# Patient Record
Sex: Male | Born: 1995 | Race: White | Hispanic: No | Marital: Single | Smoking: Never smoker
Health system: Southern US, Community
[De-identification: ages and names within clinical notes are randomized; demographics above are authoritative.]

## PROBLEM LIST (undated history)

## (undated) DIAGNOSIS — R569 Unspecified convulsions: Secondary | ICD-10-CM

## (undated) HISTORY — PX: CIRCUMCISION: SUR203

## (undated) HISTORY — DX: Unspecified convulsions: R56.9

---
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
## 2016-03-09 ENCOUNTER — Other Ambulatory Visit: Payer: Self-pay | Admitting: Pediatrics

## 2016-03-09 ENCOUNTER — Other Ambulatory Visit: Payer: Self-pay | Admitting: Family

## 2016-04-06 ENCOUNTER — Encounter (INDEPENDENT_AMBULATORY_CARE_PROVIDER_SITE_OTHER): Payer: Self-pay | Admitting: Pediatrics

## 2016-04-06 ENCOUNTER — Ambulatory Visit (INDEPENDENT_AMBULATORY_CARE_PROVIDER_SITE_OTHER): Payer: Medicaid Other | Admitting: Pediatrics

## 2016-04-06 VITALS — BP 128/68 | HR 72 | Ht 67.0 in | Wt 158.8 lb

## 2016-04-06 DIAGNOSIS — F840 Autistic disorder: Secondary | ICD-10-CM | POA: Diagnosis not present

## 2016-04-06 DIAGNOSIS — G40209 Localization-related (focal) (partial) symptomatic epilepsy and epileptic syndromes with complex partial seizures, not intractable, without status epilepticus: Secondary | ICD-10-CM

## 2016-04-06 MED ORDER — DIASTAT ACUDIAL 20 MG RE GEL: RECTAL | 5 refills | Status: DC

## 2016-04-06 MED ORDER — LEVETIRACETAM ER 750 MG PO TB24: ORAL_TABLET | ORAL | 5 refills | Status: DC

## 2016-04-06 NOTE — Patient Instructions (Signed)
Please let me know how well this works as regards his nonconvulsive seizures.  I am glad that you signed up for My Chart.
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
## 2016-04-07 ENCOUNTER — Other Ambulatory Visit (INDEPENDENT_AMBULATORY_CARE_PROVIDER_SITE_OTHER): Payer: Self-pay | Admitting: Pediatrics

## 2016-04-21 ENCOUNTER — Encounter (INDEPENDENT_AMBULATORY_CARE_PROVIDER_SITE_OTHER): Payer: Self-pay | Admitting: Pediatrics

## 2016-04-21 DIAGNOSIS — G40209 Localization-related (focal) (partial) symptomatic epilepsy and epileptic syndromes with complex partial seizures, not intractable, without status epilepticus: Secondary | ICD-10-CM

## 2016-04-30 NOTE — Telephone Encounter (Signed)
My Chart note

## 2016-05-20 ENCOUNTER — Encounter (INDEPENDENT_AMBULATORY_CARE_PROVIDER_SITE_OTHER): Payer: Self-pay | Admitting: Pediatrics

## 2016-05-25 ENCOUNTER — Encounter (INDEPENDENT_AMBULATORY_CARE_PROVIDER_SITE_OTHER): Payer: Self-pay | Admitting: Pediatrics

## 2016-05-25 ENCOUNTER — Ambulatory Visit (INDEPENDENT_AMBULATORY_CARE_PROVIDER_SITE_OTHER): Payer: Medicaid Other | Admitting: Pediatrics

## 2016-05-25 VITALS — BP 104/58 | HR 112 | Ht 67.25 in | Wt 160.2 lb

## 2016-05-25 DIAGNOSIS — G43009 Migraine without aura, not intractable, without status migrainosus: Secondary | ICD-10-CM | POA: Diagnosis not present

## 2016-05-25 DIAGNOSIS — G40209 Localization-related (focal) (partial) symptomatic epilepsy and epileptic syndromes with complex partial seizures, not intractable, without status epilepticus: Secondary | ICD-10-CM

## 2016-05-25 DIAGNOSIS — F840 Autistic disorder: Secondary | ICD-10-CM

## 2016-05-25 MED ORDER — RELPAX 40 MG PO TABS: ORAL_TABLET | ORAL | 5 refills | Status: DC

## 2016-05-25 MED ORDER — LEVETIRACETAM ER 750 MG PO TB24: ORAL_TABLET | ORAL | 5 refills | Status: DC

## 2016-05-25 NOTE — Patient Instructions (Signed)
Please send me headache calendars at the end of each month and use My Chart to let me know how often year seeing seizures.  Don't think the episode where he was unable to depress his eyes represents a seizure because he was able to respond to you.
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
## 2016-05-26 ENCOUNTER — Ambulatory Visit (INDEPENDENT_AMBULATORY_CARE_PROVIDER_SITE_OTHER): Payer: Medicaid Other | Admitting: Pediatrics

## 2016-06-29 ENCOUNTER — Emergency Department: Payer: Medicaid Other

## 2016-06-29 ENCOUNTER — Emergency Department
Admission: EM | Admit: 2016-06-29 | Discharge: 2016-06-29 | Disposition: A | Discharge status: Home / Self Care | Payer: Medicaid Other | Attending: Emergency Medicine | Admitting: Emergency Medicine

## 2016-06-29 DIAGNOSIS — R509 Fever, unspecified: Secondary | ICD-10-CM | POA: Diagnosis present

## 2016-06-29 DIAGNOSIS — J181 Lobar pneumonia, unspecified organism: Secondary | ICD-10-CM | POA: Diagnosis not present

## 2016-06-29 DIAGNOSIS — E109 Type 1 diabetes mellitus without complications: Secondary | ICD-10-CM | POA: Insufficient documentation

## 2016-06-29 DIAGNOSIS — J189 Pneumonia, unspecified organism: Secondary | ICD-10-CM

## 2016-06-29 LAB — CBC WITH DIFFERENTIAL/PLATELET
BASOS ABS: 0 10*3/uL (ref 0–0.1)
Basophils Relative: 0 %
EOS ABS: 0 10*3/uL (ref 0–0.7)
EOS PCT: 0 %
HCT: 36.2 % — ABNORMAL LOW (ref 40.0–52.0)
Hemoglobin: 12.3 g/dL — ABNORMAL LOW (ref 13.0–18.0)
LYMPHS ABS: 0.5 10*3/uL — AB (ref 1.0–3.6)
Lymphocytes Relative: 4 %
MCH: 27.1 pg (ref 26.0–34.0)
MCHC: 34 g/dL (ref 32.0–36.0)
MCV: 79.8 fL — ABNORMAL LOW (ref 80.0–100.0)
MONO ABS: 0.4 10*3/uL (ref 0.2–1.0)
Monocytes Relative: 4 %
Neutro Abs: 10.6 10*3/uL — ABNORMAL HIGH (ref 1.4–6.5)
Neutrophils Relative %: 92 %
PLATELETS: 257 10*3/uL (ref 150–440)
RBC: 4.54 MIL/uL (ref 4.40–5.90)
RDW: 12.4 % (ref 11.5–14.5)
WBC: 11.5 10*3/uL — AB (ref 3.8–10.6)

## 2016-06-29 LAB — BASIC METABOLIC PANEL
Anion gap: 8 (ref 5–15)
BUN: 10 mg/dL (ref 6–20)
CO2: 22 mmol/L (ref 22–32)
CREATININE: 1 mg/dL (ref 0.61–1.24)
Calcium: 8.7 mg/dL — ABNORMAL LOW (ref 8.9–10.3)
Chloride: 104 mmol/L (ref 101–111)
GFR calc Af Amer: >60 mL/min (ref >60)
GLUCOSE: 168 mg/dL — AB (ref 65–99)
POTASSIUM: 3.5 mmol/L (ref 3.5–5.1)
SODIUM: 134 mmol/L — AB (ref 135–145)

## 2016-06-29 LAB — INFLUENZA PANEL BY PCR (TYPE A & B)
INFLAPCR: NEGATIVE
INFLBPCR: NEGATIVE

## 2016-06-29 LAB — LACTIC ACID, PLASMA: LACTIC ACID, VENOUS: 0.9 mmol/L (ref 0.5–1.9)

## 2016-06-29 MED ORDER — SODIUM CHLORIDE 0.9 % IV BOLUS (SEPSIS)
1000.0000 mL | Freq: Once | INTRAVENOUS | Status: AC
  Administered 2016-06-29: 1000 mL via INTRAVENOUS

## 2016-06-29 MED ORDER — HYDROCOD POLST-CPM POLST ER 10-8 MG/5ML PO SUER
5.0000 mL | Freq: Once | ORAL | Status: AC
  Administered 2016-06-29: 5 mL via ORAL
  Filled 2016-06-29: qty 5

## 2016-06-29 MED ORDER — DEXTROSE 5 % IV SOLN: 1.0000 g | INTRAVENOUS | Status: DC

## 2016-06-29 MED ORDER — CEFTRIAXONE SODIUM-DEXTROSE 1-3.74 GM-% IV SOLR
1.0000 g | INTRAVENOUS | Status: DC
  Administered 2016-06-29: 1 g via INTRAVENOUS
  Filled 2016-06-29: qty 50

## 2016-06-29 MED ORDER — ONDANSETRON HCL 4 MG/2ML IJ SOLN
4.0000 mg | Freq: Once | INTRAMUSCULAR | Status: AC
  Administered 2016-06-29: 4 mg via INTRAVENOUS
  Filled 2016-06-29: qty 2

## 2016-06-29 MED ORDER — HYDROCOD POLST-CPM POLST ER 10-8 MG/5ML PO SUER: 5.0000 mL | Freq: Two times a day (BID) | ORAL | 0 refills | Status: DC

## 2016-06-29 MED ORDER — IBUPROFEN 800 MG PO TABS
800.0000 mg | ORAL_TABLET | Freq: Once | ORAL | Status: AC
  Administered 2016-06-29: 800 mg via ORAL
  Filled 2016-06-29: qty 1

## 2016-06-29 MED ORDER — CEPHALEXIN 500 MG PO CAPS: 500.0000 mg | ORAL_CAPSULE | Freq: Three times a day (TID) | ORAL | 0 refills | Status: DC

## 2016-06-29 NOTE — ED Triage Notes (Addendum)
Pt arrived via ems from home with complaints of persistent cough that worried mother. Pt is autistic and per mom pt has very little verbal communication ability. Mother reports their family has suffered a respiratory infection and everyone but pt has gotten better. Mother reports that tonight pt began to uncontrollable shake which caused her to call ems. Pt does suffer from seizures however mother reports "that was not a seizure."

## 2016-06-29 NOTE — ED Notes (Signed)
Pt no longer coughing.
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
## 2016-06-29 NOTE — Discharge Instructions (Signed)
1. You may alternate Tylenol and Motrin every 4 hours as needed for fever greater than 100.44F. 2. Take antibiotic as prescribed (Keflex 500 mg 3 times daily 7 days. 3. You may take Tussionex twice daily as needed for cough. 4. Return to the ER for worsening symptoms, persistent vomiting, difficulty breathing or other concerns.

## 2016-07-04 LAB — CULTURE, BLOOD (ROUTINE X 2)
Culture: NO GROWTH
Culture: NO GROWTH

## 2016-09-23 ENCOUNTER — Encounter (INDEPENDENT_AMBULATORY_CARE_PROVIDER_SITE_OTHER): Payer: Self-pay | Admitting: Pediatrics

## 2017-02-08 ENCOUNTER — Ambulatory Visit (INDEPENDENT_AMBULATORY_CARE_PROVIDER_SITE_OTHER): Payer: Medicaid Other | Admitting: Pediatrics

## 2017-02-27 ENCOUNTER — Ambulatory Visit (INDEPENDENT_AMBULATORY_CARE_PROVIDER_SITE_OTHER): Payer: Medicaid Other | Admitting: Pediatrics

## 2017-02-28 ENCOUNTER — Ambulatory Visit (INDEPENDENT_AMBULATORY_CARE_PROVIDER_SITE_OTHER): Payer: Medicaid Other | Admitting: Pediatrics

## 2017-03-14 ENCOUNTER — Ambulatory Visit (INDEPENDENT_AMBULATORY_CARE_PROVIDER_SITE_OTHER): Payer: Medicaid Other | Admitting: Pediatrics

## 2017-03-27 ENCOUNTER — Telehealth (INDEPENDENT_AMBULATORY_CARE_PROVIDER_SITE_OTHER): Payer: Self-pay | Admitting: Pediatrics

## 2017-03-27 DIAGNOSIS — G40209 Localization-related (focal) (partial) symptomatic epilepsy and epileptic syndromes with complex partial seizures, not intractable, without status epilepticus: Secondary | ICD-10-CM

## 2017-03-27 MED ORDER — LEVETIRACETAM ER 750 MG PO TB24: ORAL_TABLET | ORAL | 5 refills | Status: DC

## 2017-03-27 NOTE — Telephone Encounter (Signed)
Rx has been sent electronically to the pharmacy on file 

## 2017-03-27 NOTE — Telephone Encounter (Signed)
°  Who's calling (name and relationship to patient) : Marcelino Duster, legal guardian Best contact number: [phone] Provider they see: Sharene Skeans Reason for call:     PRESCRIPTION REFILL ONLY  Name of prescription: Keppra  Pharmacy: [facility] in [place]

## 2017-04-17 ENCOUNTER — Encounter (INDEPENDENT_AMBULATORY_CARE_PROVIDER_SITE_OTHER): Payer: Self-pay | Admitting: Pediatrics

## 2017-04-17 ENCOUNTER — Ambulatory Visit (INDEPENDENT_AMBULATORY_CARE_PROVIDER_SITE_OTHER): Payer: Medicaid Other | Admitting: Pediatrics

## 2017-04-17 VITALS — BP 110/70 | HR 76 | Ht 67.25 in | Wt 160.6 lb

## 2017-04-17 DIAGNOSIS — G43009 Migraine without aura, not intractable, without status migrainosus: Secondary | ICD-10-CM

## 2017-04-17 DIAGNOSIS — F840 Autistic disorder: Secondary | ICD-10-CM | POA: Diagnosis not present

## 2017-04-17 DIAGNOSIS — G40209 Localization-related (focal) (partial) symptomatic epilepsy and epileptic syndromes with complex partial seizures, not intractable, without status epilepticus: Secondary | ICD-10-CM

## 2017-04-17 MED ORDER — LEVETIRACETAM ER 750 MG PO TB24: ORAL_TABLET | ORAL | 5 refills | Status: DC

## 2017-04-17 MED ORDER — RELPAX 40 MG PO TABS: ORAL_TABLET | ORAL | 5 refills | Status: DC

## 2017-04-17 MED ORDER — DIASTAT ACUDIAL 20 MG RE GEL: RECTAL | 5 refills | Status: DC

## 2017-04-17 MED ORDER — PROMETHAZINE HCL 25 MG RE SUPP: 25.0000 mg | Freq: Three times a day (TID) | RECTAL | 5 refills | Status: DC | PRN
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
## 2017-07-08 ENCOUNTER — Encounter (INDEPENDENT_AMBULATORY_CARE_PROVIDER_SITE_OTHER): Payer: Self-pay | Admitting: Pediatrics

## 2017-07-11 ENCOUNTER — Telehealth (INDEPENDENT_AMBULATORY_CARE_PROVIDER_SITE_OTHER): Payer: Self-pay | Admitting: Pediatrics

## 2017-07-11 DIAGNOSIS — R112 Nausea with vomiting, unspecified: Secondary | ICD-10-CM

## 2017-07-11 MED ORDER — PROMETHAZINE HCL 25 MG PO TABS: ORAL_TABLET | ORAL | 0 refills | Status: DC

## 2017-07-11 NOTE — Telephone Encounter (Signed)
At mother's request a prescription was issued for oral promethazine.

## 2017-08-01 ENCOUNTER — Encounter (INDEPENDENT_AMBULATORY_CARE_PROVIDER_SITE_OTHER): Payer: Self-pay | Admitting: Pediatrics

## 2017-09-06 ENCOUNTER — Other Ambulatory Visit (INDEPENDENT_AMBULATORY_CARE_PROVIDER_SITE_OTHER): Payer: Self-pay | Admitting: Pediatrics

## 2017-09-06 DIAGNOSIS — R112 Nausea with vomiting, unspecified: Secondary | ICD-10-CM

## 2017-10-11 ENCOUNTER — Other Ambulatory Visit (INDEPENDENT_AMBULATORY_CARE_PROVIDER_SITE_OTHER): Payer: Self-pay | Admitting: Pediatrics

## 2017-10-11 DIAGNOSIS — G43009 Migraine without aura, not intractable, without status migrainosus: Secondary | ICD-10-CM

## 2017-10-19 ENCOUNTER — Telehealth (INDEPENDENT_AMBULATORY_CARE_PROVIDER_SITE_OTHER): Payer: Self-pay | Admitting: Pediatrics

## 2017-10-19 NOTE — Telephone Encounter (Signed)
PA has been completed. We are waiting on approval

## 2017-10-19 NOTE — Telephone Encounter (Signed)
°  Who's calling (name and relationship to patient) : Marcelino Duster (Mother) Best contact number: [phone] Provider they see: Dr. Sharene Skeans  Reason for call: Mom stated a prior authorization is needed for Relpax.

## 2017-11-21 ENCOUNTER — Ambulatory Visit (INDEPENDENT_AMBULATORY_CARE_PROVIDER_SITE_OTHER): Payer: Medicaid Other | Admitting: Pediatrics

## 2017-12-12 ENCOUNTER — Ambulatory Visit (INDEPENDENT_AMBULATORY_CARE_PROVIDER_SITE_OTHER): Payer: Medicaid Other | Admitting: Pediatrics

## 2017-12-12 ENCOUNTER — Telehealth (INDEPENDENT_AMBULATORY_CARE_PROVIDER_SITE_OTHER): Payer: Self-pay | Admitting: Pediatrics

## 2017-12-12 NOTE — Telephone Encounter (Signed)
°  Who's calling (name and relationship to patient) : Marcelino DusterMichelle (Mother) Best contact number: [phone] Provider they see: Dr. Sharene SkeansHickling Reason for call: Mom lvm stating she needed to rs pt's appt. I placed call to mom and rs appt.

## 2017-12-18 ENCOUNTER — Other Ambulatory Visit (INDEPENDENT_AMBULATORY_CARE_PROVIDER_SITE_OTHER): Payer: Self-pay | Admitting: Pediatrics

## 2017-12-18 DIAGNOSIS — R112 Nausea with vomiting, unspecified: Secondary | ICD-10-CM

## 2017-12-28 ENCOUNTER — Ambulatory Visit (INDEPENDENT_AMBULATORY_CARE_PROVIDER_SITE_OTHER): Payer: Medicaid Other | Admitting: Pediatrics

## 2018-01-23 ENCOUNTER — Encounter (INDEPENDENT_AMBULATORY_CARE_PROVIDER_SITE_OTHER): Payer: Self-pay | Admitting: Pediatrics

## 2018-01-23 ENCOUNTER — Ambulatory Visit (INDEPENDENT_AMBULATORY_CARE_PROVIDER_SITE_OTHER): Payer: Medicaid Other | Admitting: Pediatrics

## 2018-01-23 VITALS — BP 120/70 | HR 64 | Ht 68.0 in | Wt 168.2 lb

## 2018-01-23 DIAGNOSIS — F840 Autistic disorder: Secondary | ICD-10-CM

## 2018-01-23 DIAGNOSIS — F802 Mixed receptive-expressive language disorder: Secondary | ICD-10-CM | POA: Diagnosis not present

## 2018-01-23 DIAGNOSIS — R112 Nausea with vomiting, unspecified: Secondary | ICD-10-CM

## 2018-01-23 DIAGNOSIS — G40209 Localization-related (focal) (partial) symptomatic epilepsy and epileptic syndromes with complex partial seizures, not intractable, without status epilepticus: Secondary | ICD-10-CM

## 2018-01-23 DIAGNOSIS — G43009 Migraine without aura, not intractable, without status migrainosus: Secondary | ICD-10-CM

## 2018-01-23 MED ORDER — RELPAX 40 MG PO TABS: ORAL_TABLET | ORAL | 5 refills | Status: DC

## 2018-01-23 MED ORDER — LEVETIRACETAM ER 750 MG PO TB24: ORAL_TABLET | ORAL | 5 refills | Status: DC

## 2018-01-23 MED ORDER — DIASTAT ACUDIAL 20 MG RE GEL: RECTAL | 5 refills | Status: DC

## 2018-01-23 MED ORDER — PROMETHAZINE HCL 25 MG PO TABS: ORAL_TABLET | ORAL | 5 refills | Status: DC
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
## 2018-01-23 NOTE — Patient Instructions (Signed)
I am concerned about the increase in frequency of migraines.  I am glad that when he takes Relpax that it brings a headache under control quickly.  Wanted to keep track of the headache calendar.  There are 3 lifestyle behaviors that are important to minimize headaches.  You should sleep 8-9 hours at night time.  Bedtime should be a set time for going to bed and waking up with few exceptions.  You need to drink about 40-8 ounces of water per day, more on days when you are out in the heat.  This works out to 2 1/2 - 3 - 16 ounce water bottles per day.  You may need to flavor the water so that you will be more likely to drink it.  Do not use Kool-Aid or other sugar drinks because they add empty calories and actually increase urine output.  You need to eat 3 meals per day.  You should not skip meals.  The meal does not have to be a big one.  Make daily entries into the headache calendar and sent it to me at the end of each calendar month.  I will call you or your parents and we will discuss the results of the headache calendar and make a decision about changing treatment if indicated.  You should take 400 mg of ibuprofen at the onset of headaches that are severe enough to cause obvious pain and other symptoms.  Relpax if it appears that this is a migrainous problem.  Give him promethazine if he seems nauseated.  Please use My Chart to communicate with me and send his calendars.

## 2018-03-26 ENCOUNTER — Other Ambulatory Visit (INDEPENDENT_AMBULATORY_CARE_PROVIDER_SITE_OTHER): Payer: Self-pay | Admitting: Pediatrics

## 2018-03-26 DIAGNOSIS — G43009 Migraine without aura, not intractable, without status migrainosus: Secondary | ICD-10-CM

## 2018-04-17 ENCOUNTER — Encounter (INDEPENDENT_AMBULATORY_CARE_PROVIDER_SITE_OTHER): Payer: Self-pay

## 2018-04-18 ENCOUNTER — Encounter (INDEPENDENT_AMBULATORY_CARE_PROVIDER_SITE_OTHER): Payer: Self-pay | Admitting: Pediatrics

## 2018-04-20 NOTE — Telephone Encounter (Signed)
I wrote the letter requested.  Please fax it to the agency specified in this note.

## 2018-04-20 NOTE — Progress Notes (Signed)
I wrote a letter to you please send it to the agency requested by mother.

## 2018-07-02 ENCOUNTER — Other Ambulatory Visit (INDEPENDENT_AMBULATORY_CARE_PROVIDER_SITE_OTHER): Payer: Self-pay | Admitting: Pediatrics

## 2018-07-02 DIAGNOSIS — G40209 Localization-related (focal) (partial) symptomatic epilepsy and epileptic syndromes with complex partial seizures, not intractable, without status epilepticus: Secondary | ICD-10-CM

## 2018-07-02 MED ORDER — LEVETIRACETAM ER 750 MG PO TB24: ORAL_TABLET | ORAL | 1 refills | Status: DC

## 2018-07-04 ENCOUNTER — Other Ambulatory Visit (INDEPENDENT_AMBULATORY_CARE_PROVIDER_SITE_OTHER): Payer: Self-pay | Admitting: Pediatrics

## 2018-07-04 DIAGNOSIS — R112 Nausea with vomiting, unspecified: Secondary | ICD-10-CM

## 2018-07-05 NOTE — Telephone Encounter (Signed)
°  Who's calling (name and relationship to patient) : (mom) Best contact number: [phone] Provider they see: Sharene Skeans Reason for call:     PRESCRIPTION REFILL ONLY  Name of prescription: Phenegran 25mg  Pharmacy: [facility], [address]

## 2018-07-05 NOTE — Telephone Encounter (Signed)
Called and LVM to call back

## 2018-08-10 ENCOUNTER — Ambulatory Visit (INDEPENDENT_AMBULATORY_CARE_PROVIDER_SITE_OTHER): Payer: Medicaid Other | Admitting: Pediatrics

## 2018-08-10 ENCOUNTER — Encounter (INDEPENDENT_AMBULATORY_CARE_PROVIDER_SITE_OTHER): Payer: Self-pay | Admitting: Pediatrics

## 2018-08-10 VITALS — BP 130/78 | HR 72 | Ht 68.0 in | Wt 166.0 lb

## 2018-08-10 DIAGNOSIS — G909 Disorder of the autonomic nervous system, unspecified: Secondary | ICD-10-CM | POA: Diagnosis not present

## 2018-08-10 DIAGNOSIS — G43009 Migraine without aura, not intractable, without status migrainosus: Secondary | ICD-10-CM | POA: Diagnosis not present

## 2018-08-10 DIAGNOSIS — F840 Autistic disorder: Secondary | ICD-10-CM

## 2018-08-10 DIAGNOSIS — R112 Nausea with vomiting, unspecified: Secondary | ICD-10-CM

## 2018-08-10 DIAGNOSIS — G40209 Localization-related (focal) (partial) symptomatic epilepsy and epileptic syndromes with complex partial seizures, not intractable, without status epilepticus: Secondary | ICD-10-CM

## 2018-08-10 MED ORDER — LEVETIRACETAM ER 750 MG PO TB24: ORAL_TABLET | ORAL | 1 refills | Status: DC

## 2018-08-10 MED ORDER — PROMETHAZINE HCL 25 MG PO TABS: ORAL_TABLET | ORAL | 5 refills | Status: DC

## 2018-08-10 MED ORDER — LEVETIRACETAM ER 750 MG PO TB24: ORAL_TABLET | ORAL | 5 refills | Status: DC

## 2018-08-10 MED ORDER — RELPAX 40 MG PO TABS: ORAL_TABLET | ORAL | 0 refills | Status: DC

## 2018-08-10 MED ORDER — DIASTAT ACUDIAL 20 MG RE GEL: RECTAL | 5 refills | Status: DC

## 2018-08-10 MED ORDER — RELPAX 40 MG PO TABS: ORAL_TABLET | ORAL | 5 refills | Status: DC
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
## 2018-08-10 NOTE — Patient Instructions (Signed)
I am pleased that Peter [name] is doing well.  I refilled prescriptions that are filled from this office.  We will plan to see him in 6 months.

## 2018-09-01 ENCOUNTER — Encounter (INDEPENDENT_AMBULATORY_CARE_PROVIDER_SITE_OTHER): Payer: Self-pay

## 2019-04-13 ENCOUNTER — Other Ambulatory Visit (INDEPENDENT_AMBULATORY_CARE_PROVIDER_SITE_OTHER): Payer: Self-pay | Admitting: Pediatrics

## 2019-04-13 DIAGNOSIS — G43009 Migraine without aura, not intractable, without status migrainosus: Secondary | ICD-10-CM

## 2019-04-13 DIAGNOSIS — R112 Nausea with vomiting, unspecified: Secondary | ICD-10-CM

## 2019-08-27 ENCOUNTER — Encounter (INDEPENDENT_AMBULATORY_CARE_PROVIDER_SITE_OTHER): Payer: Self-pay

## 2019-08-28 ENCOUNTER — Telehealth: Payer: Self-pay

## 2019-08-28 NOTE — Telephone Encounter (Signed)
I did reach out to the patient today to see if he wants to come in sooner than his 04/20/20 appointment now that Dr. Virgilio Frees will take up to 8 Medicaid patients a month-patient did not answer so I left a VM requesting a call back to re-schedule

## 2019-08-30 ENCOUNTER — Other Ambulatory Visit: Payer: Self-pay

## 2019-08-30 ENCOUNTER — Telehealth (INDEPENDENT_AMBULATORY_CARE_PROVIDER_SITE_OTHER): Payer: Medicaid Other | Admitting: Pediatrics

## 2019-08-30 ENCOUNTER — Encounter (INDEPENDENT_AMBULATORY_CARE_PROVIDER_SITE_OTHER): Payer: Self-pay | Admitting: Pediatrics

## 2019-08-30 VITALS — Wt 176.0 lb

## 2019-08-30 DIAGNOSIS — R112 Nausea with vomiting, unspecified: Secondary | ICD-10-CM

## 2019-08-30 DIAGNOSIS — G40209 Localization-related (focal) (partial) symptomatic epilepsy and epileptic syndromes with complex partial seizures, not intractable, without status epilepticus: Secondary | ICD-10-CM

## 2019-08-30 DIAGNOSIS — G43009 Migraine without aura, not intractable, without status migrainosus: Secondary | ICD-10-CM

## 2019-08-30 DIAGNOSIS — F802 Mixed receptive-expressive language disorder: Secondary | ICD-10-CM | POA: Diagnosis not present

## 2019-08-30 DIAGNOSIS — F840 Autistic disorder: Secondary | ICD-10-CM

## 2019-08-30 MED ORDER — LEVETIRACETAM ER 750 MG PO TB24: ORAL_TABLET | ORAL | 3 refills | Status: DC

## 2019-08-30 MED ORDER — RELPAX 40 MG PO TABS: ORAL_TABLET | ORAL | 5 refills | Status: DC

## 2019-08-30 MED ORDER — PROMETHAZINE HCL 25 MG PO TABS: ORAL_TABLET | ORAL | 5 refills | Status: DC

## 2019-08-30 MED ORDER — DIASTAT ACUDIAL 20 MG RE GEL: RECTAL | 5 refills | Status: DC
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
## 2019-08-30 NOTE — Patient Instructions (Signed)
It was a pleasure to see you today. Thank you for inviting me into your home. I am sorry that Social Security has been such a pain. We will work with them in with you to get records to them once they send [place] the request.  I am glad that his seizures are under control. We talked about Diastat which is the rectal diazepam that you have given him to abort his seizures. I am going to prescribe that. There is another form of diazepam that is nasal called Valtoco. This works quite well but might be somewhat difficult to get into his nose although given his age and size it would seem to me that it might be easier than the Diastat.  I will refill his levetiracetam for seizures, Relpax for his migraines, and promethazine for nausea and vomiting associated with his migraines.  If there is anything else that I can do between now and when I see you again in a year, please let me know.

## 2020-02-02 ENCOUNTER — Emergency Department
Admission: EM | Admit: 2020-02-02 | Discharge: 2020-02-02 | Disposition: A | Discharge status: Home / Self Care | Payer: Medicaid Other | Attending: Emergency Medicine | Admitting: Emergency Medicine

## 2020-02-02 ENCOUNTER — Emergency Department: Payer: Medicaid Other

## 2020-02-02 DIAGNOSIS — Y939 Activity, unspecified: Secondary | ICD-10-CM | POA: Diagnosis not present

## 2020-02-02 DIAGNOSIS — Z79899 Other long term (current) drug therapy: Secondary | ICD-10-CM | POA: Insufficient documentation

## 2020-02-02 DIAGNOSIS — Y929 Unspecified place or not applicable: Secondary | ICD-10-CM | POA: Insufficient documentation

## 2020-02-02 DIAGNOSIS — Y999 Unspecified external cause status: Secondary | ICD-10-CM | POA: Diagnosis not present

## 2020-02-02 DIAGNOSIS — S99922A Unspecified injury of left foot, initial encounter: Secondary | ICD-10-CM | POA: Insufficient documentation

## 2020-02-02 DIAGNOSIS — X58XXXA Exposure to other specified factors, initial encounter: Secondary | ICD-10-CM | POA: Diagnosis not present

## 2020-02-02 DIAGNOSIS — E109 Type 1 diabetes mellitus without complications: Secondary | ICD-10-CM | POA: Insufficient documentation
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
## 2020-02-02 NOTE — ED Triage Notes (Signed)
Pt presents via POV c/o left pinky toe injury. Mother reports blue and red streaks. Pt has hx DM. Type 1. Pt has autism.

## 2020-02-02 NOTE — ED Notes (Signed)
Given crackers and applesauce

## 2020-02-28 ENCOUNTER — Other Ambulatory Visit (INDEPENDENT_AMBULATORY_CARE_PROVIDER_SITE_OTHER): Payer: Self-pay | Admitting: Pediatrics

## 2020-02-28 DIAGNOSIS — G43009 Migraine without aura, not intractable, without status migrainosus: Secondary | ICD-10-CM

## 2020-02-28 NOTE — Telephone Encounter (Signed)
Please send to the pharmacy °

## 2020-03-12 LAB — HM DIABETES EYE EXAM

## 2020-04-19 NOTE — Progress Notes (Signed)
Name: Peter [name]  MRN/ DOB: [id number], [birth year]   Age/ Sex: 24 y.o., male    PCP: Ellamae Sia, MD   Reason for Endocrinology Evaluation: Type 1 Diabetes Mellitus     Date of Initial Endocrinology Visit: 04/20/2020     PATIENT IDENTIFIER: Mr. Peter [name] is a 24 y.o. male with a past medical history of T1DM, Autistic dosorder, ADHD, and epilepsy. The patient presented for initial endocrinology clinic visit on 04/20/2020 for consultative assistance with his diabetes management.    HPI: Mr. Strausbaugh was mother Selinda Eon   Diagnosed with DM at age 21  Currently checking blood sugars every 2 hours Hypoglycemia episodes : 0            Hemoglobin A1c has ranged from 7.3%in 2019, peaking at 12.3% in 2008. Patient required assistance for hypoglycemia: no Patient has required hospitalization within the last 1 year from hyper or hypoglycemia: no  In terms of diet, the patient eats 3 meals a day , drinks a shake between breakfast and lunch   Has to sleep with BG > 100 mg/dL    HOME DIABETES REGIMEN: Lantus 40 units daily   Novolog 1:12 grams  CF 1 unit for every 50 over 150    Statin: NO ACE-I/ARB: No Prior Diabetic Education:Yes   METER DOWNLOAD SUMMARY: Date range evaluated: 10/25-11/01/2020 Average Number Tests/Day = 10.9 Overall Mean FS Glucose = 173   BG Ranges: Low = 75 High = 318   Hypoglycemic Events/30 Days: BG < 50 = 0 Episodes of symptomatic severe hypoglycemia = 0   DIABETIC COMPLICATIONS: Microvascular complications:    Denies: retinopathy, neuropathy, CKD   Last eye exam: Completed 03/12/2020  Macrovascular complications:    Denies: CAD, PVD, CVA   PAST HISTORY: Past Medical History:  Past Medical History:  Diagnosis Date  . Seizures ([place])    Past Surgical History:  Past Surgical History:  Procedure Laterality Date  . CIRCUMCISION  1998  . DENTAL SURGERY  2004      Social History:  reports that he has never smoked. He has  never used smokeless tobacco. He reports that he does not drink alcohol and does not use drugs. Family History:  Family History  Problem Relation Age of Onset  . Breast cancer Paternal Grandfather        Died at 75     HOME MEDICATIONS: Allergies as of 04/20/2020      Reactions   Amphetamine-dextroamphetamine Other (See Comments)   aggression   Bee Venom Other (See Comments)   Seroquel [quetiapine Fumarate]    Aripiprazole Rash, Other (See Comments)   Sleepiness, migraines,Speech problems, swallowing problems, crossing of eyes, violence, tics.    Azithromycin Rash, Other (See Comments)   hives   Cefdinir Rash, Other (See Comments)   Texture causes emesis   Clarithromycin Rash, Other (See Comments)   hives   Clonidine Rash, Other (See Comments)   More than $RemoveB'3mg'GJROXePr$  per day causes aggression and blood pressure drops   Cyproheptadine Rash, Other (See Comments)   Upper body jerks, nervous system distress   Hornet Venom Rash   Lamotrigine Rash, Other (See Comments)   Rash around eyes, pulls out eyelids   Midazolam Rash, Other (See Comments)   Heart rate drop suddenly and dramatically   Penicillins Rash, Other (See Comments)   hives   Quetiapine Rash, Other (See Comments)    Sleepiness, migraines,Speech problems, swallowing problems, crossing of eyes, violence, tics.    Risperidone Rash,  Other (See Comments)   Over $Rem'3mg'XIXy$  causes seizures.   Topiramate Rash, Other (See Comments)   Increased seizure frequency   Valproic Acid Rash, Other (See Comments)   Personality change, losses muscle tone in face.      Medication List       Accurate as of April 20, 2020  2:17 PM. If you have any questions, ask your nurse or doctor.        STOP taking these medications   cephALEXin 500 MG capsule Commonly known as: KEFLEX Stopped by: Dorita Sciara, MD     TAKE these medications   Accu-Chek Guide test strip Generic drug: glucose blood 1 each by Other route as directed. Use as  instructed What changed:   how much to take  how to take this  when to take this  additional instructions Changed by: Dorita Sciara, MD   Accu-Chek Multiclix Lancet Dev Kit Test daily before all meals/snacks and once before bedtime.   accu-chek multiclix lancets Use as directed to test blood sugar 12 times daily   B-D INS SYR ULTRAFINE 1CC/30G 30G X 1/2" 1 ML Misc Generic drug: Insulin Syringe-Needle U-100 Inject into the skin.   cetirizine 10 MG tablet Commonly known as: ZYRTEC   Diastat AcuDial 20 MG Gel Generic drug: diazepam Give 20 mg rectally after 2 minutes of persistent seizure   glucagon 1 MG injection Inject 1 mg into the vein once as needed (Administer 1 mg injection IM when blood sugar is below 75, if patient is unable to swallow for any reason and or oral sugar intake does not increase blood sugar level.).   guanFACINE 2 MG Tb24 ER tablet Commonly known as: INTUNIV TK 1 T PO D   Lantus SoloStar 100 UNIT/ML Solostar Pen Generic drug: insulin glargine 32 Units. Inject up to 32 units subcutaneously each day.   Levetiracetam 750 MG Tb24 Take 2 twice daily   LORazepam 1 MG tablet Commonly known as: ATIVAN Take 1 mg by mouth. Take 1 tablet as needed for agitation   melatonin 3 MG Tabs tablet Take 10 mg by mouth.   methylphenidate 20 MG tablet Commonly known as: RITALIN Take by mouth.   NovoLOG FlexPen 100 UNIT/ML FlexPen Generic drug: insulin aspart Use as directed. Max daily dose 60 units.   omeprazole 20 MG capsule Commonly known as: PRILOSEC Take 20 mg by mouth.   promethazine 25 MG tablet Commonly known as: PHENERGAN Take 1 tablet as needed for nausea and vomiting   RA Blood Glucose Monitor Devi Use as directed to test blood sugar 12 times daily Dose: 1   Relpax 40 MG tablet Generic drug: eletriptan TAKE 1 TAB AS DIRECTED AS NEEDED, MAY REPEAT IN 2 HOURS IF HEADACHE PERSISTS OR RECURS   risperiDONE 1 MG tablet Commonly known  as: RISPERDAL Take $RemoveBefore'2mg'RZyYHxVUlrgeY$  in the morning; $RemoveBefo'3mg'qGYTjNyvPvT$  at night   UltiCare Micro Pen Needles 32G X 4 MM Misc Generic drug: Insulin Pen Needle Use as directed to inject insulin 6 - 8 times daily.        ALLERGIES: Allergies  Allergen Reactions  . Amphetamine-Dextroamphetamine Other (See Comments)    aggression  . Bee Venom Other (See Comments)  . Seroquel [Quetiapine Fumarate]   . Aripiprazole Rash and Other (See Comments)    Sleepiness, migraines,Speech problems, swallowing problems, crossing of eyes, violence, tics.   . Azithromycin Rash and Other (See Comments)    hives  . Cefdinir Rash and Other (See Comments)  Texture causes emesis  . Clarithromycin Rash and Other (See Comments)    hives   . Clonidine Rash and Other (See Comments)    More than $RemoveB'3mg'KDhZAoma$  per day causes aggression and blood pressure drops  . Cyproheptadine Rash and Other (See Comments)    Upper body jerks, nervous system distress  . Hornet Venom Rash  . Lamotrigine Rash and Other (See Comments)    Rash around eyes, pulls out eyelids   . Midazolam Rash and Other (See Comments)    Heart rate drop suddenly and dramatically  . Penicillins Rash and Other (See Comments)    hives  . Quetiapine Rash and Other (See Comments)     Sleepiness, migraines,Speech problems, swallowing problems, crossing of eyes, violence, tics.   . Risperidone Rash and Other (See Comments)    Over $Rem'3mg'Sjhc$  causes seizures.  . Topiramate Rash and Other (See Comments)    Increased seizure frequency  . Valproic Acid Rash and Other (See Comments)    Personality change, losses muscle tone in face.     REVIEW OF SYSTEMS: A comprehensive ROS was conducted with the patient and is negative except as per HPI    OBJECTIVE:   VITAL SIGNS: BP 116/80   Pulse 90   Ht $R'5\' 8"'DW$  (1.727 m)   Wt 166 lb 8 oz (75.5 kg)   SpO2 98%   BMI 25.32 kg/m    PHYSICAL EXAM:  General: Pt appears well and is in NAD  HEENT: Head: Unremarkable with good dentition.  Oropharynx clear without exudate.  Eyes: External eye exam normal without stare, lid lag or exophthalmos.  EOM intact.    Neck: General: Supple without adenopathy or carotid bruits. Thyroid: Thyroid size normal.  No goiter or nodules appreciated  Lungs: Clear with good BS bilat with no rales, rhonchi, or wheezes  Heart: RRR   Abdomen: Normoactive bowel sounds, soft, nontender, without masses or organomegaly palpable  Extremities:  Lower extremities - No pretibial edema. No lesions.  Neuro: MS is good with appropriate affect, pt is alert and Ox3     DATA REVIEWED:  Lab Results  Component Value Date   HGBA1C 7.5 (A) 04/20/2020   Lab Results  Component Value Date   CREATININE 1.00 06/29/2016  Results for ARLISS, FRISINA (MRN [id number]) as of 04/21/2020 08:04  Ref. Range 04/20/2020 13:41  Sodium Latest Ref Range: 135 - 145 mEq/L 138  Potassium Latest Ref Range: 3.5 - 5.1 mEq/L 4.2  Chloride Latest Ref Range: 96 - 112 mEq/L 103  CO2 Latest Ref Range: 19 - 32 mEq/L 27  Glucose Latest Ref Range: 70 - 99 mg/dL 186 (H)  BUN Latest Ref Range: 6 - 23 mg/dL 9  Creatinine Latest Ref Range: 0.40 - 1.50 mg/dL 0.90  Calcium Latest Ref Range: 8.4 - 10.5 mg/dL 9.5  GFR Latest Ref Range: >60.00 mL/min 120.82  Total CHOL/HDL Ratio Unknown 3  Cholesterol Latest Ref Range: 0 - 200 mg/dL 124  HDL Cholesterol Latest Ref Range: >39.00 mg/dL 44.90  LDL (calc) Latest Ref Range: 0 - 99 mg/dL 62  MICROALB/CREAT RATIO Latest Ref Range: 0.0 - 30.0 mg/g 2.4  NonHDL Unknown 79.21  Triglycerides Latest Ref Range: 0 - 149 mg/dL 85.0  VLDL Latest Ref Range: 0.0 - 40.0 mg/dL 17.0  TSH Latest Ref Range: 0.35 - 4.50 uIU/mL 2.17  Creatinine,U Latest Units: mg/dL 29.3  Microalb, Ur Latest Ref Range: 0.0 - 1.9 mg/dL <0.7   ASSESSMENT / PLAN / RECOMMENDATIONS:   1)  Type 2 Diabetes Mellitus, Acceptable control, Without complications - Most recent A1c of 7.5 %. Goal A1c < 7.5 %.      Barriers to self diabetes  care is autism and seizure disorder, mother is main caretaker.   She checks glucose up to 11x a day due to fear of hypoglycemia and the fact that he is verbally limited at times  She does not believe he would be a good candidate for CGM   In review of meter download , he has been noted with hyperglycemia between 5-7 pm ,this is after eating a snack. They typically give him prandial insulin after he eats due to previously reported vomiting following a meal   MEDICATIONS: - Continue Lantus 40 units daily  - Continue Novolog 1 units for every 12 grams of carbohydrates  - CF: Novolog ( BG-110/50)  EDUCATION / INSTRUCTIONS:  BG monitoring instructions: Patient is instructed to check his blood sugars 11 times a day, before and 2 hrs after meals .  Call [facility] if: BG persistently < 70 . I reviewed the Rule of 15 for the treatment of hypoglycemia in detail with the patient. Literature supplied.   2) Diabetic complications:   Eye: Does not have known diabetic retinopathy.   Neuro/ Feet: Does not have known diabetic peripheral neuropathy.  Renal: Patient does not have known baseline CKD. He is not on an ACEI/ARB at present.   3) Lipids: No indication to start lipids at this time          Signed electronically by: Mack Guise, MD  [facility]  [facility] [address] [place], [place] [address] Phone: [phone] FAX: [phone]   CC: Ellamae Sia, MD [place] [address] Phone: [phone]  Fax: [phone]    Return to Endocrinology clinic as below: Future Appointments  Date Time Provider [place]  08/19/2020  1:00 PM Evann Erazo, Melanie Crazier, MD [facility] None

## 2020-04-20 ENCOUNTER — Encounter: Payer: Self-pay | Admitting: Internal Medicine

## 2020-04-20 ENCOUNTER — Other Ambulatory Visit: Payer: Self-pay

## 2020-04-20 ENCOUNTER — Ambulatory Visit (INDEPENDENT_AMBULATORY_CARE_PROVIDER_SITE_OTHER): Payer: Medicaid Other | Admitting: Internal Medicine

## 2020-04-20 VITALS — BP 116/80 | HR 90 | Ht 68.0 in | Wt 166.5 lb

## 2020-04-20 DIAGNOSIS — E1069 Type 1 diabetes mellitus with other specified complication: Secondary | ICD-10-CM

## 2020-04-20 LAB — BASIC METABOLIC PANEL
BUN: 9 mg/dL (ref 6–23)
CO2: 27 mEq/L (ref 19–32)
Calcium: 9.5 mg/dL (ref 8.4–10.5)
Chloride: 103 mEq/L (ref 96–112)
Creatinine, Ser: 0.9 mg/dL (ref 0.40–1.50)
GFR: 120.82 mL/min (ref >60.00)
Glucose, Bld: 186 mg/dL — ABNORMAL HIGH (ref 70–99)
Potassium: 4.2 mEq/L (ref 3.5–5.1)
Sodium: 138 mEq/L (ref 135–145)

## 2020-04-20 LAB — TSH: TSH: 2.17 u[IU]/mL (ref 0.35–4.50)

## 2020-04-20 LAB — POCT GLYCOSYLATED HEMOGLOBIN (HGB A1C): Hemoglobin A1C: 7.5 % — AB (ref 4.0–5.6)

## 2020-04-20 LAB — LIPID PANEL
Cholesterol: 124 mg/dL (ref 0–200)
HDL: 44.9 mg/dL (ref >39.00)
LDL Cholesterol: 62 mg/dL (ref 0–99)
NonHDL: 79.21
Total CHOL/HDL Ratio: 3
Triglycerides: 85 mg/dL (ref 0.0–149.0)
VLDL: 17 mg/dL (ref 0.0–40.0)

## 2020-04-20 MED ORDER — ACCU-CHEK GUIDE VI STRP: 1.0000 | ORAL_STRIP | 12 refills | Status: DC

## 2020-04-20 NOTE — Patient Instructions (Addendum)
-   Continue Lantus 40 units daily  - Continue Novolog 1 units for every 12 grams of carbohydrates  - Novolog correctional insulin: ADD extra units on insulin to your meal-time Novolog dose if your blood sugars are higher than 200. Use the scale below to help guide you:   Blood sugar before meal Number of units to inject  Less than 150 0 unit  151-  200 1 units  201 -  250 2 units  251 -  300 3 units  301 -  350 4 units  351 -  400 5 units     HOW TO TREAT LOW BLOOD SUGARS (Blood sugar LESS THAN 70 MG/DL)  Please follow the RULE OF 15 for the treatment of hypoglycemia treatment (when your (blood sugars are less than 70 mg/dL)    STEP 1: Take 15 grams of carbohydrates when your blood sugar is low, which includes:   3-4 GLUCOSE TABS  OR  3-4 OZ OF JUICE OR REGULAR SODA OR  ONE TUBE OF GLUCOSE GEL     STEP 2: RECHECK blood sugar in 15 MINUTES STEP 3: If your blood sugar is still low at the 15 minute recheck --> then, go back to STEP 1 and treat AGAIN with another 15 grams of carbohydrates.  

## 2020-04-21 ENCOUNTER — Encounter: Payer: Self-pay | Admitting: Internal Medicine

## 2020-04-21 ENCOUNTER — Telehealth: Payer: Self-pay | Admitting: Internal Medicine

## 2020-04-21 LAB — MICROALBUMIN / CREATININE URINE RATIO
Creatinine,U: 29.3 mg/dL
Microalb Creat Ratio: 2.4 mg/g (ref 0.0–30.0)
Microalb, Ur: <0.7 mg/dL (ref 0.0–1.9)

## 2020-04-21 MED ORDER — ULTICARE MICRO PEN NEEDLES 32G X 4 MM MISC
1.0000 | 3 refills | Status: DC
Start: 2020-04-21 — End: 2021-04-14

## 2020-04-21 MED ORDER — NOVOLOG FLEXPEN 100 UNIT/ML ~~LOC~~ SOPN
PEN_INJECTOR | SUBCUTANEOUS | 3 refills | Status: DC
Start: 2020-04-21 — End: 2020-09-10

## 2020-04-21 MED ORDER — LANTUS SOLOSTAR 100 UNIT/ML ~~LOC~~ SOPN: 40.0000 [IU] | PEN_INJECTOR | Freq: Every day | SUBCUTANEOUS | 3 refills | Status: DC

## 2020-04-21 MED ORDER — ACCU-CHEK GUIDE VI STRP: 1.0000 | ORAL_STRIP | 12 refills | Status: DC

## 2020-04-21 NOTE — Telephone Encounter (Signed)
Melissa [facility]y [facility] requests to be called at ph# [phone] re: Clarification on directions on RX for Lantus

## 2020-04-21 NOTE — Addendum Note (Signed)
Addended by: Scarlette Shorts on: 04/21/2020 12:21 PM   Modules accepted: Orders

## 2020-04-21 NOTE — Telephone Encounter (Signed)
Please see below.

## 2020-04-21 NOTE — Telephone Encounter (Signed)
New Patient and was seen on 04/20/2020

## 2020-04-21 NOTE — Telephone Encounter (Signed)
Called [facility] and gave the clarification that according to Dr [facility] notes, patient is to inject 40 units daily.

## 2020-05-19 ENCOUNTER — Encounter (INDEPENDENT_AMBULATORY_CARE_PROVIDER_SITE_OTHER): Payer: Self-pay

## 2020-05-19 DIAGNOSIS — R112 Nausea with vomiting, unspecified: Secondary | ICD-10-CM

## 2020-05-20 ENCOUNTER — Other Ambulatory Visit (INDEPENDENT_AMBULATORY_CARE_PROVIDER_SITE_OTHER): Payer: Self-pay | Admitting: Pediatrics

## 2020-05-20 DIAGNOSIS — R112 Nausea with vomiting, unspecified: Secondary | ICD-10-CM

## 2020-05-20 MED ORDER — PROMETHAZINE HCL 25 MG PO TABS: ORAL_TABLET | ORAL | 5 refills | Status: DC

## 2020-05-20 NOTE — Telephone Encounter (Signed)
I will refill the promethazine.  [facility] will put kids under general anesthesia.  The hemoglobin A1c and Covid shots are terrific.

## 2020-05-20 NOTE — Addendum Note (Signed)
Addended by: Deetta Perla on: 05/20/2020 04:35 PM   Modules accepted: Orders

## 2020-05-20 NOTE — Telephone Encounter (Signed)
Please send to pharmacy.

## 2020-05-20 NOTE — Addendum Note (Signed)
Addended by: Harland Dingwall A on: 05/20/2020 04:24 PM   Modules accepted: Orders

## 2020-05-20 NOTE — Telephone Encounter (Signed)
There is documentation in the chart that I sent this earlier in the day but the pharmacy says they never received it.  I told him to call our office if for some reason he did not get received again.

## 2020-07-15 ENCOUNTER — Other Ambulatory Visit (INDEPENDENT_AMBULATORY_CARE_PROVIDER_SITE_OTHER): Payer: Self-pay | Admitting: Pediatrics

## 2020-07-15 DIAGNOSIS — G40209 Localization-related (focal) (partial) symptomatic epilepsy and epileptic syndromes with complex partial seizures, not intractable, without status epilepticus: Secondary | ICD-10-CM

## 2020-08-19 ENCOUNTER — Ambulatory Visit: Payer: Medicaid Other | Admitting: Internal Medicine

## 2020-08-26 ENCOUNTER — Ambulatory Visit (INDEPENDENT_AMBULATORY_CARE_PROVIDER_SITE_OTHER): Payer: Medicaid Other | Admitting: Pediatrics

## 2020-08-26 ENCOUNTER — Encounter (INDEPENDENT_AMBULATORY_CARE_PROVIDER_SITE_OTHER): Payer: Self-pay | Admitting: Pediatrics

## 2020-08-26 ENCOUNTER — Other Ambulatory Visit: Payer: Self-pay

## 2020-08-26 VITALS — BP 110/74 | HR 72 | Ht 68.0 in | Wt 166.6 lb

## 2020-08-26 DIAGNOSIS — F840 Autistic disorder: Secondary | ICD-10-CM | POA: Diagnosis not present

## 2020-08-26 DIAGNOSIS — G40209 Localization-related (focal) (partial) symptomatic epilepsy and epileptic syndromes with complex partial seizures, not intractable, without status epilepticus: Secondary | ICD-10-CM | POA: Diagnosis not present

## 2020-08-26 DIAGNOSIS — G43009 Migraine without aura, not intractable, without status migrainosus: Secondary | ICD-10-CM | POA: Diagnosis not present

## 2020-08-26 DIAGNOSIS — R112 Nausea with vomiting, unspecified: Secondary | ICD-10-CM | POA: Diagnosis not present

## 2020-08-26 MED ORDER — PROMETHAZINE HCL 25 MG PO TABS: ORAL_TABLET | ORAL | 5 refills | Status: DC

## 2020-08-26 MED ORDER — LEVETIRACETAM ER 750 MG PO TB24: ORAL_TABLET | ORAL | 5 refills | Status: DC

## 2020-08-26 MED ORDER — DIASTAT ACUDIAL 20 MG RE GEL
RECTAL | 5 refills | Status: DC
Start: 2020-08-26 — End: 2020-09-01

## 2020-08-26 MED ORDER — RELPAX 40 MG PO TABS: ORAL_TABLET | ORAL | 5 refills | Status: DC
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
## 2020-08-26 NOTE — Patient Instructions (Signed)
It was a pleasure to see you today.  I am glad that Peter [name] is doing so well.  I would like to know that the headaches that he is having are tension rather than migraines but I would like to know the breakdown.  Prescriptions have been refilled for levetiracetam, promethazine, Diastat, and Relpax.  We will plan to see him in a year.  I retire March 12, 2021.  We will continue to provide care for him until that time.  Peter [name] will be followed in our practice despite the fact that he is an adult because of his underlying autism.  He had any questions or concerns between now and then please let me know.

## 2020-09-01 ENCOUNTER — Other Ambulatory Visit (INDEPENDENT_AMBULATORY_CARE_PROVIDER_SITE_OTHER): Payer: Self-pay | Admitting: Pediatrics

## 2020-09-01 DIAGNOSIS — G40209 Localization-related (focal) (partial) symptomatic epilepsy and epileptic syndromes with complex partial seizures, not intractable, without status epilepticus: Secondary | ICD-10-CM

## 2020-09-01 NOTE — Telephone Encounter (Signed)
Please send to the pharmacy °

## 2020-09-10 ENCOUNTER — Ambulatory Visit (INDEPENDENT_AMBULATORY_CARE_PROVIDER_SITE_OTHER): Payer: Medicaid Other | Admitting: Internal Medicine

## 2020-09-10 ENCOUNTER — Other Ambulatory Visit: Payer: Self-pay

## 2020-09-10 VITALS — BP 116/78 | HR 95 | Ht 68.0 in | Wt 165.5 lb

## 2020-09-10 DIAGNOSIS — E1069 Type 1 diabetes mellitus with other specified complication: Secondary | ICD-10-CM

## 2020-09-10 LAB — POCT GLYCOSYLATED HEMOGLOBIN (HGB A1C): Hemoglobin A1C: 7.4 % — AB (ref 4.0–5.6)

## 2020-09-10 MED ORDER — NOVOLOG FLEXPEN 100 UNIT/ML ~~LOC~~ SOPN: PEN_INJECTOR | SUBCUTANEOUS | 3 refills | Status: DC

## 2020-09-10 NOTE — Progress Notes (Signed)
Name: Peter [name]  Age/ Sex: 25 y.o., male   MRN/ DOB: [id number], [birth year]     PCP: Ellamae Sia, MD   Reason for Endocrinology Evaluation: Type 1 Diabetes Mellitus  Initial Endocrine Consultative Visit: 04/20/2020    PATIENT IDENTIFIER: Peter [name] is a 25 y.o. male with a past medical history of T1DM, Autistic dosorder, ADHD, and epilepsy . The patient has followed with Endocrinology clinic since 04/20/2020 for consultative assistance with management of his diabetes.  DIABETIC HISTORY:  Peter [name] was diagnosed with DM at age 79. His hemoglobin A1c has ranged from 7.3%in 2019, peaking at 12.3% in 2008.  Has to sleep with BG > 100 mg/dL SUBJECTIVE:   During the last visit (04/20/2020): A1c   Today (09/10/2020): Peter [name] is here with his mother Selinda Eon and father.  He checks his blood sugars 10 times daily, preprandial to breakfast  The patient has not had hypoglycemic episodes since the last clinic visit.  Denies nausea or diarrhea   HOME DIABETES REGIMEN:  Lantus 40 units daily   Novolog 1 units for every 12 grams of carbohydrates  CF: Novolog ( BG-110/50)  Statin: no ACE-I/ARB: no Prior Diabetic Education: yes   METER DOWNLOAD SUMMARY: Date range evaluated: 3/17-3/31/2022  Average Number Tests/Day = 10.2 Overall Mean FS Glucose = 161 Standard Deviation = 45  BG Ranges: Low = 79 High = 297   Hypoglycemic Events/30 Days: BG < 50 = 0 Episodes of symptomatic severe hypoglycemia = 0    DIABETIC COMPLICATIONS: Microvascular complications:    Denies: retinopathy, neuropathy, CKD   Last eye exam: Completed 03/12/2020  Macrovascular complications:    Denies: CAD, PVD, CVA     HISTORY:  Past Medical History:  Past Medical History:  Diagnosis Date  . Seizures ([place])    Past Surgical History:  Past Surgical History:  Procedure Laterality Date  . CIRCUMCISION  1998  . DENTAL SURGERY  2004    Social History:  reports that he has  never smoked. He has never used smokeless tobacco. He reports that he does not drink alcohol and does not use drugs. Family History:  Family History  Problem Relation Age of Onset  . Breast cancer Paternal Grandfather        Died at 30     HOME MEDICATIONS: Allergies as of 09/10/2020      Reactions   Amphetamine-dextroamphetamine Other (See Comments)   aggression   Bee Venom Other (See Comments)   Seroquel [quetiapine Fumarate]    Aripiprazole Rash, Other (See Comments)   Sleepiness, migraines,Speech problems, swallowing problems, crossing of eyes, violence, tics.    Azithromycin Rash, Other (See Comments)   hives   Cefdinir Rash, Other (See Comments)   Texture causes emesis   Clarithromycin Rash, Other (See Comments)   hives   Clonidine Rash, Other (See Comments)   More than $RemoveB'3mg'yOxQwfjK$  per day causes aggression and blood pressure drops   Cyproheptadine Rash, Other (See Comments)   Upper body jerks, nervous system distress   Hornet Venom Rash   Lamotrigine Rash, Other (See Comments)   Rash around eyes, pulls out eyelids   Midazolam Rash, Other (See Comments)   Heart rate drop suddenly and dramatically   Penicillins Rash, Other (See Comments)   hives   Quetiapine Rash, Other (See Comments)    Sleepiness, migraines,Speech problems, swallowing problems, crossing of eyes, violence, tics.    Risperidone Rash, Other (See Comments)   Over $Rem'3mg'GNnx$  causes seizures.  Topiramate Rash, Other (See Comments)   Increased seizure frequency   Valproic Acid Rash, Other (See Comments)   Personality change, losses muscle tone in face.      Medication List       Accurate as of September 10, 2020  7:47 AM. If you have any questions, ask your nurse or doctor.        Accu-Chek Guide test strip Generic drug: glucose blood 1 each by Other route as directed. Up to 20x daily   Accu-Chek Multiclix Lancet Dev Kit Test daily before all meals/snacks and once before bedtime.   accu-chek multiclix  lancets Use as directed to test blood sugar 12 times daily   cetirizine 10 MG tablet Commonly known as: ZYRTEC   Diastat AcuDial 20 MG Gel Generic drug: diazepam GIVE 20 MG RECTALLY AFTER 2 MINUTES OF PERSISTENT SEIZURE   EPINEPHrine 0.3 mg/0.3 mL Soaj injection Commonly known as: EPI-PEN Inject 1 pen injector intramuscularly single dose as needed for anaphylaxis. Call 911. Can use 2nd pen in 5 minutes if no improvement.   fluticasone 50 MCG/ACT nasal spray Commonly known as: FLONASE SHAKE LIQUID AND USE 2 SPRAYS IN EACH NOSTRIL ONCE DAILY   glucagon 1 MG injection Inject 1 mg into the vein once as needed (Administer 1 mg injection IM when blood sugar is below 75, if patient is unable to swallow for any reason and or oral sugar intake does not increase blood sugar level.).   guanFACINE 2 MG Tb24 ER tablet Commonly known as: INTUNIV TK 1 T PO D   Insulin Syringe-Needle U-100 30G X 1/2" 1 ML Misc Inject into the skin.   Lantus SoloStar 100 UNIT/ML Solostar Pen Generic drug: insulin glargine Inject 40 Units into the skin daily. Inject up to 32 units subcutaneously each day.   Levetiracetam 750 MG Tb24 TAKE 2 TABLETS BY MOUTH TWICE A DAY   LORazepam 1 MG tablet Commonly known as: ATIVAN Take 1 mg by mouth. Take 1 tablet as needed for agitation   melatonin 3 MG Tabs tablet Take 10 mg by mouth.   methylphenidate 20 MG tablet Commonly known as: RITALIN Take by mouth.   NovoLOG FlexPen 100 UNIT/ML FlexPen Generic drug: insulin aspart Use as directed. Max daily dose 100 units. What changed: additional instructions Changed by: Dorita Sciara, MD   omeprazole 20 MG capsule Commonly known as: PRILOSEC Take 20 mg by mouth.   promethazine 25 MG tablet Commonly known as: PHENERGAN TAKE ONE TABLET BY MOUTH AS NEEDED FOR NAUSEA ASSOCIATED WITH MIGRAINE   RA Blood Glucose Monitor Devi Use as directed to test blood sugar 12 times daily Dose: 1   Relpax 40 MG  tablet Generic drug: eletriptan TAKE 1 TAB AS DIRECTED AS NEEDED, MAY REPEAT IN 2 HOURS IF HEADACHE PERSISTS OR RECURS   risperiDONE 1 MG tablet Commonly known as: RISPERDAL Take $RemoveBefore'2mg'IGQtINWyfnmPr$  in the morning; $RemoveBefo'3mg'tGYycpeBvLj$  at night   UltiCare Micro Pen Needles 32G X 4 MM Misc Generic drug: Insulin Pen Needle Inject 1 Device into the skin as directed. Use as directed to inject insulin 6 - 8 times daily.        OBJECTIVE:   Vital Signs: BP 116/78   Pulse 95   Ht $R'5\' 8"'ZH$  (1.727 m)   Wt 165 lb 8 oz (75.1 kg)   SpO2 98%   BMI 25.16 kg/m   Wt Readings from Last 3 Encounters:  09/10/20 165 lb 8 oz (75.1 kg)  08/26/20 166 lb 9.6 oz (  75.6 kg)  04/20/20 166 lb 8 oz (75.5 kg)     Exam: General: Pt appears well and is in NAD  Lungs: Clear with good BS bilat with no rales, rhonchi, or wheezes  Heart: RRR with normal S1 and S2 and no gallops; no murmurs; no rub  Abdomen: Normoactive bowel sounds, soft, nontender  Extremities: No pretibial edema.   Neuro: MS is good with appropriate affect, pt is alert and Ox3     DM Foot Exam 09/10/2020    The skin of the feet is intact without sores or ulcerations. The pedal pulses are 2+ on right and 2+ on left. The sensation is difficult to assess due to autism , pt kept saying yes     DATA REVIEWED:  Lab Results  Component Value Date   HGBA1C 7.4 (A) 09/10/2020   HGBA1C 7.5 (A) 04/20/2020   Lab Results  Component Value Date   MICROALBUR <0.7 04/20/2020   LDLCALC 62 04/20/2020   CREATININE 0.90 04/20/2020   Lab Results  Component Value Date   MICRALBCREAT 2.4 04/20/2020     Lab Results  Component Value Date   CHOL 124 04/20/2020   HDL 44.90 04/20/2020   LDLCALC 62 04/20/2020   TRIG 85.0 04/20/2020   CHOLHDL 3 04/20/2020         ASSESSMENT / PLAN / RECOMMENDATIONS:   1) Type 1 Diabetes Mellitus, Optimally controlled, Without complications - Most recent A1c of 7.4%. Goal A1c < 7.5%.   - Pt with insulin sensitivity, no recent hypoglycemia   - He did not tolerate CGM in the past per mother  - I have asked them to ease of on finger sticks, he gets checked on average 10 x a day, but due to history of recurrent hypoglycemia and autism , they feel more comfortable with frequent checks.  - NO changes   MEDICATIONS: - Continue Lantus 40 units daily  - Continue Novolog 1 units for every 12 grams of carbohydrates  - CF: Novolog ( BG-110/50)  EDUCATION / INSTRUCTIONS:  BG monitoring instructions: Patient is instructed to check his blood sugars 4 times a day, before meals and bedtime   Call [facility] if: BG persistently < 70 . I reviewed the Rule of 15 for the treatment of hypoglycemia in detail with the patient. Literature supplied.   2) Diabetic complications:   Eye: Does nothave known diabetic retinopathy.   Neuro/ Feet: Does not have known diabetic peripheral neuropathy .   Renal: Patient does not have known baseline CKD. He   is not  on an ACEI/ARB at present.      F/U in 6 months    Signed electronically by: Mack Guise, MD  [facility]  [facility] [address] [place], [place] [address] Phone: [phone] FAX: [phone]   CC: Ellamae Sia, MD [place] [address] Phone: [phone]  Fax: [phone]  Return to Endocrinology clinic as below: No future appointments.

## 2020-09-10 NOTE — Patient Instructions (Signed)
-   Continue Lantus 40 units daily  - Continue Novolog 1 units for every 12 grams of carbohydrates  - Novolog correctional insulin: ADD extra units on insulin to your meal-time Novolog dose if your blood sugars are higher than 200. Use the scale below to help guide you:   Blood sugar before meal Number of units to inject  Less than 150 0 unit  151-  200 1 units  201 -  250 2 units  251 -  300 3 units  301 -  350 4 units  351 -  400 5 units     HOW TO TREAT LOW BLOOD SUGARS (Blood sugar LESS THAN 70 MG/DL)  Please follow the RULE OF 15 for the treatment of hypoglycemia treatment (when your (blood sugars are less than 70 mg/dL)    STEP 1: Take 15 grams of carbohydrates when your blood sugar is low, which includes:   3-4 GLUCOSE TABS  OR  3-4 OZ OF JUICE OR REGULAR SODA OR  ONE TUBE OF GLUCOSE GEL     STEP 2: RECHECK blood sugar in 15 MINUTES STEP 3: If your blood sugar is still low at the 15 minute recheck --> then, go back to STEP 1 and treat AGAIN with another 15 grams of carbohydrates.

## 2020-10-15 ENCOUNTER — Encounter (INDEPENDENT_AMBULATORY_CARE_PROVIDER_SITE_OTHER): Payer: Self-pay

## 2020-10-16 ENCOUNTER — Encounter (INDEPENDENT_AMBULATORY_CARE_PROVIDER_SITE_OTHER): Payer: Self-pay

## 2020-10-20 ENCOUNTER — Encounter (INDEPENDENT_AMBULATORY_CARE_PROVIDER_SITE_OTHER): Payer: Self-pay | Admitting: *Deleted

## 2020-11-08 ENCOUNTER — Other Ambulatory Visit (INDEPENDENT_AMBULATORY_CARE_PROVIDER_SITE_OTHER): Payer: Self-pay | Admitting: Family

## 2020-11-08 DIAGNOSIS — G40209 Localization-related (focal) (partial) symptomatic epilepsy and epileptic syndromes with complex partial seizures, not intractable, without status epilepticus: Secondary | ICD-10-CM

## 2021-01-13 ENCOUNTER — Encounter (INDEPENDENT_AMBULATORY_CARE_PROVIDER_SITE_OTHER): Payer: Self-pay

## 2021-01-30 ENCOUNTER — Other Ambulatory Visit (INDEPENDENT_AMBULATORY_CARE_PROVIDER_SITE_OTHER): Payer: Self-pay | Admitting: Pediatrics

## 2021-01-30 ENCOUNTER — Other Ambulatory Visit (INDEPENDENT_AMBULATORY_CARE_PROVIDER_SITE_OTHER): Payer: Self-pay | Admitting: Family

## 2021-01-30 DIAGNOSIS — G43009 Migraine without aura, not intractable, without status migrainosus: Secondary | ICD-10-CM

## 2021-01-30 DIAGNOSIS — G40209 Localization-related (focal) (partial) symptomatic epilepsy and epileptic syndromes with complex partial seizures, not intractable, without status epilepticus: Secondary | ICD-10-CM

## 2021-02-01 NOTE — Telephone Encounter (Signed)
Please send to the pharmacy °

## 2021-03-12 ENCOUNTER — Other Ambulatory Visit: Payer: Self-pay | Admitting: Internal Medicine

## 2021-03-17 ENCOUNTER — Ambulatory Visit: Payer: Medicaid Other | Admitting: Internal Medicine

## 2021-03-25 ENCOUNTER — Ambulatory Visit: Payer: Medicaid Other | Admitting: Internal Medicine

## 2021-04-14 ENCOUNTER — Other Ambulatory Visit: Payer: Self-pay

## 2021-04-14 ENCOUNTER — Ambulatory Visit (INDEPENDENT_AMBULATORY_CARE_PROVIDER_SITE_OTHER): Payer: Medicaid Other | Admitting: Internal Medicine

## 2021-04-14 ENCOUNTER — Encounter: Payer: Self-pay | Admitting: Internal Medicine

## 2021-04-14 VITALS — BP 124/80 | HR 102 | Ht 68.0 in | Wt 164.0 lb

## 2021-04-14 DIAGNOSIS — E1065 Type 1 diabetes mellitus with hyperglycemia: Secondary | ICD-10-CM

## 2021-04-14 DIAGNOSIS — E1069 Type 1 diabetes mellitus with other specified complication: Secondary | ICD-10-CM

## 2021-04-14 LAB — BASIC METABOLIC PANEL
BUN: 7 mg/dL (ref 6–23)
CO2: 28 mEq/L (ref 19–32)
Calcium: 10.1 mg/dL (ref 8.4–10.5)
Chloride: 101 mEq/L (ref 96–112)
Creatinine, Ser: 1.05 mg/dL (ref 0.40–1.50)
GFR: 99.72 mL/min (ref >60.00)
Glucose, Bld: 141 mg/dL — ABNORMAL HIGH (ref 70–99)
Potassium: 4.3 mEq/L (ref 3.5–5.1)
Sodium: 138 mEq/L (ref 135–145)

## 2021-04-14 LAB — LIPID PANEL
Cholesterol: 148 mg/dL (ref 0–200)
HDL: 51 mg/dL (ref >39.00)
LDL Cholesterol: 71 mg/dL (ref 0–99)
NonHDL: 97.04
Total CHOL/HDL Ratio: 3
Triglycerides: 132 mg/dL (ref 0.0–149.0)
VLDL: 26.4 mg/dL (ref 0.0–40.0)

## 2021-04-14 LAB — POCT GLYCOSYLATED HEMOGLOBIN (HGB A1C): Hemoglobin A1C: 8.3 % — AB (ref 4.0–5.6)

## 2021-04-14 LAB — TSH: TSH: 0.87 u[IU]/mL (ref 0.35–5.50)

## 2021-04-14 LAB — MICROALBUMIN / CREATININE URINE RATIO
Creatinine,U: 32.1 mg/dL
Microalb Creat Ratio: 2.2 mg/g (ref 0.0–30.0)
Microalb, Ur: <0.7 mg/dL (ref 0.0–1.9)

## 2021-04-14 MED ORDER — ACCU-CHEK MULTICLIX LANCETS MISC: 1.0000 | Freq: Four times a day (QID) | 3 refills | Status: AC

## 2021-04-14 MED ORDER — ULTICARE MICRO PEN NEEDLES 32G X 4 MM MISC: 1.0000 | 3 refills | Status: AC

## 2021-04-14 MED ORDER — ACCU-CHEK GUIDE VI STRP: 1.0000 | ORAL_STRIP | 12 refills | Status: AC

## 2021-04-14 MED ORDER — NOVOLOG FLEXPEN 100 UNIT/ML ~~LOC~~ SOPN
PEN_INJECTOR | SUBCUTANEOUS | 3 refills | Status: DC
Start: 2021-04-14 — End: 2021-04-16

## 2021-04-14 MED ORDER — LANTUS SOLOSTAR 100 UNIT/ML ~~LOC~~ SOPN: 40.0000 [IU] | PEN_INJECTOR | Freq: Every day | SUBCUTANEOUS | 3 refills | Status: AC

## 2021-04-14 NOTE — Patient Instructions (Addendum)
-   Continue Lantus 40 units daily  - Continue Novolog 1 units for every 12 grams of carbohydrates  - Novolog correctional insulin: ADD extra units on insulin to your meal-time Novolog dose if your blood sugars are higher than 200. Use the scale below to help guide you:   Blood sugar before meal Number of units to inject  Less than 150 0 unit  151-  200 1 units  201 -  250 2 units  251 -  300 3 units  301 -  350 4 units  351 -  400 5 units    PLease DO NOT Give Novolog unless its  been 3 -4 hours from the last dose of Novolog   HOW TO TREAT LOW BLOOD SUGARS (Blood sugar LESS THAN 70 MG/DL) Please follow the RULE OF 15 for the treatment of hypoglycemia treatment (when your (blood sugars are less than 70 mg/dL)   STEP 1: Take 15 grams of carbohydrates when your blood sugar is low, which includes:  3-4 GLUCOSE TABS  OR 3-4 OZ OF JUICE OR REGULAR SODA OR ONE TUBE OF GLUCOSE GEL    STEP 2: RECHECK blood sugar in 15 MINUTES STEP 3: If your blood sugar is still low at the 15 minute recheck --> then, go back to STEP 1 and treat AGAIN with another 15 grams of carbohydrates.

## 2021-04-14 NOTE — Progress Notes (Signed)
Name: Peter [name]  Age/ Sex: 25 y.o., male   MRN/ DOB: [id number], August 13, 1995     PCP: Ellamae Sia, MD (Inactive)   Reason for Endocrinology Evaluation: Type 1 Diabetes Mellitus  Initial Endocrine Consultative Visit: 04/20/2020    PATIENT IDENTIFIER: Peter [name] is a 25 y.o. male with a past medical history of T1DM, Autistic dosorder, ADHD, and epilepsy . The patient has followed with Endocrinology clinic since 04/20/2020 for consultative assistance with management of his diabetes.  DIABETIC HISTORY:  Peter [name] was diagnosed with DM at age 73. His hemoglobin A1c has ranged from 7.3%in 2019, peaking at 12.3% in 2008.  Has to sleep with BG > 100 mg/dL SUBJECTIVE:   During the last visit (09/10/2020): A1c 7.4% no changes were made   Today (04/14/2021): Peter [name] is here with his Brother Peter [name] .  He checks his blood sugars 10 times daily, preprandial to breakfast  The patient has not had hypoglycemic episodes since the last clinic visit.  Denies nausea, vomiting  or diarrhea   HOME DIABETES REGIMEN:  Lantus 40 units daily   Novolog 1 units for every 12 grams of carbohydrates  CF: Novolog ( BG-110/50)   Statin: no ACE-I/ARB: no Prior Diabetic Education: yes   METER DOWNLOAD SUMMARY: Date range evaluated: 10/20-11/07/2020  Average Number Tests/Day = 10.1 Overall Mean FS Glucose = 180 Standard Deviation = 47  BG Ranges: Low = 70 High = 303   Hypoglycemic Events/30 Days: BG < 50 = 0 Episodes of symptomatic severe hypoglycemia = 0    DIABETIC COMPLICATIONS: Microvascular complications:    Denies: retinopathy, neuropathy, CKD  Last eye exam: Completed 03/12/2020   Macrovascular complications:    Denies: CAD, PVD, CVA     HISTORY:  Past Medical History:  Past Medical History:  Diagnosis Date   Seizures ([place])    Past Surgical History:  Past Surgical History:  Procedure Laterality Date   [place]  2004   Social  History:  reports that he has never smoked. He has never used smokeless tobacco. He reports that he does not drink alcohol and does not use drugs. Family History:  Family History  Problem Relation Age of Onset   Breast cancer Paternal Grandfather        Died at 31     HOME MEDICATIONS: Allergies as of 04/14/2021       Reactions   Amphetamine-dextroamphetamine Other (See Comments)   aggression   Bee Venom Other (See Comments)   Seroquel [quetiapine Fumarate]    Aripiprazole Rash, Other (See Comments)   Sleepiness, migraines,  Speech problems, swallowing problems, crossing of eyes, violence, tics.    Azithromycin Rash, Other (See Comments)   hives   Cefdinir Rash, Other (See Comments)   Texture causes emesis   Clarithromycin Rash, Other (See Comments)   hives   Clonidine Rash, Other (See Comments)   More than 65m per day causes aggression and blood pressure drops   Cyproheptadine Rash, Other (See Comments)   Upper body jerks, nervous system distress   Hornet Venom Rash   Lamotrigine Rash, Other (See Comments)   Rash around eyes, pulls out eyelids   Midazolam Rash, Other (See Comments)   Heart rate drop suddenly and dramatically   Penicillins Rash, Other (See Comments)   hives   Quetiapine Rash, Other (See Comments)    Sleepiness, migraines,  Speech problems, swallowing problems, crossing of eyes, violence, tics.    Risperidone  Rash, Other (See Comments)   Over 69m causes seizures.   Topiramate Rash, Other (See Comments)   Increased seizure frequency   Valproic Acid Rash, Other (See Comments)   Personality change, losses muscle tone in face.        Medication List        Accurate as of April 14, 2021  1:32 PM. If you have any questions, ask your nurse or doctor.          Accu-Chek Guide test strip Generic drug: glucose blood 1 each by Other route as directed. Up to 20x daily   Accu-Chek Multiclix Lancet Dev Kit Test daily before all meals/snacks and once  before bedtime.   accu-chek multiclix lancets Use as directed to test blood sugar 12 times daily   cetirizine 10 MG tablet Commonly known as: ZYRTEC   Diastat AcuDial 20 MG Gel Generic drug: diazepam GIVE 20 MG RECTALLY AFTER 2 MINUTES OF PERSISTENT SEIZURE   EPINEPHrine 0.3 mg/0.3 mL Soaj injection Commonly known as: EPI-PEN Inject 1 pen injector intramuscularly single dose as needed for anaphylaxis. Call 911. Can use 2nd pen in 5 minutes if no improvement.   fluticasone 50 MCG/ACT nasal spray Commonly known as: FLONASE SHAKE LIQUID AND USE 2 SPRAYS IN EACH NOSTRIL ONCE DAILY   glucagon 1 MG injection Inject 1 mg into the vein once as needed (Administer 1 mg injection IM when blood sugar is below 75, if patient is unable to swallow for any reason and or oral sugar intake does not increase blood sugar level.).   guanFACINE 2 MG Tb24 ER tablet Commonly known as: INTUNIV TK 1 T PO D   Insulin Syringe-Needle U-100 30G X 1/2" 1 ML Misc Inject into the skin.   Lantus SoloStar 100 UNIT/ML Solostar Pen Generic drug: insulin glargine INJECT 40 UNITS INTO THE SKIN DAILY.   Levetiracetam 750 MG Tb24 TAKE 2 TABLETS BY MOUTH TWICE A DAY   LORazepam 1 MG tablet Commonly known as: ATIVAN Take 1 mg by mouth. Take 1 tablet as needed for agitation   melatonin 3 MG Tabs tablet Take 10 mg by mouth.   methylphenidate 20 MG tablet Commonly known as: RITALIN Take by mouth.   NovoLOG FlexPen 100 UNIT/ML FlexPen Generic drug: insulin aspart USE AS DIRECTED. MAX DAILY DOSE 100 UNITS.   omeprazole 20 MG capsule Commonly known as: PRILOSEC Take 20 mg by mouth.   promethazine 25 MG tablet Commonly known as: PHENERGAN TAKE ONE TABLET BY MOUTH AS NEEDED FOR NAUSEA ASSOCIATED WITH MIGRAINE   RA Blood Glucose Monitor Devi Use as directed to test blood sugar 12 times daily Dose: 1   Relpax 40 MG tablet Generic drug: eletriptan TAKE 1 TAB AS DIRECTED AS NEEDED. MAY REPEAT IN 2 HRS IF  HEADACHE PERSISTS OR RECURS   risperiDONE 1 MG tablet Commonly known as: RISPERDAL Take 2189min the morning; 89m789mt night   UltiCare Micro Pen Needles 32G X 4 MM Misc Generic drug: Insulin Pen Needle Inject 1 Device into the skin as directed. Use as directed to inject insulin 6 - 8 times daily.         OBJECTIVE:   Vital Signs: BP 124/80 (BP Location: Left Arm, Patient Position: Sitting, Cuff Size: Small)   Pulse (!) 102   Ht 5' 8" (1.727 m)   Wt 164 lb (74.4 kg)   SpO2 99%   BMI 24.94 kg/m   Wt Readings from Last 3 Encounters:  04/14/21 164 lb (74.4  kg)  09/10/20 165 lb 8 oz (75.1 kg)  08/26/20 166 lb 9.6 oz (75.6 kg)     Exam: General: Pt appears well and is in NAD  Lungs: Clear with good BS bilat with no rales, rhonchi, or wheezes  Heart: RRR with normal S1 and S2 and no gallops; no murmurs; no rub  Abdomen: Normoactive bowel sounds, soft, nontender  Extremities: No pretibial edema.   Neuro: MS is good with appropriate affect, pt is alert and Ox3     DM Foot Exam 09/10/2020    The skin of the feet is intact without sores or ulcerations. The pedal pulses are 2+ on right and 2+ on left. The sensation is difficult to assess due to autism , pt kept saying yes     DATA REVIEWED:  Lab Results  Component Value Date   HGBA1C 8.3 (A) 04/14/2021   HGBA1C 7.4 (A) 09/10/2020   HGBA1C 7.5 (A) 04/20/2020   Results for Peter [name], Peter [name] (MRN [id number]) as of 04/14/2021 17:29  Ref. Range 04/14/2021 13:52  Sodium Latest Ref Range: 135 - 145 mEq/L 138  Potassium Latest Ref Range: 3.5 - 5.1 mEq/L 4.3  Chloride Latest Ref Range: 96 - 112 mEq/L 101  CO2 Latest Ref Range: 19 - 32 mEq/L 28  Glucose Latest Ref Range: 70 - 99 mg/dL 141 (H)  BUN Latest Ref Range: 6 - 23 mg/dL 7  Creatinine Latest Ref Range: 0.40 - 1.50 mg/dL 1.05  Calcium Latest Ref Range: 8.4 - 10.5 mg/dL 10.1  GFR Latest Ref Range: >60.00 mL/min 99.72  Total CHOL/HDL Ratio Unknown 3  Cholesterol Latest Ref  Range: 0 - 200 mg/dL 148  HDL Cholesterol Latest Ref Range: >39.00 mg/dL 51.00  LDL (calc) Latest Ref Range: 0 - 99 mg/dL 71  MICROALB/CREAT RATIO Latest Ref Range: 0.0 - 30.0 mg/g 2.2  NonHDL Unknown 97.04  Triglycerides Latest Ref Range: 0.0 - 149.0 mg/dL 132.0  VLDL Latest Ref Range: 0.0 - 40.0 mg/dL 26.4  TSH Latest Ref Range: 0.35 - 5.50 uIU/mL 0.87   ASSESSMENT / PLAN / RECOMMENDATIONS:   1) Type 1 Diabetes Mellitus, Sub-Optimally controlled, Without complications - Most recent A1c of 8.3%. Goal A1c < 7.5%.   - He did not tolerate CGM in the past per mother  - I have asked  the brother to ease off on the finger sticks again, this is done sometimes hourly or 2 hrs through the day and nigh ( when does the pt sleep?) but due to history of recurrent hypoglycemia and autism , they feel more comfortable with frequent checks. - I emphasized the importance of taking Novolog Pre-meal  , I also emphasized the importance of separating NovoLog doses by 3-4 hours, to prevent stacking of insulin and hypoglycemia. -BMP normal as well as lipid and TFTs - No changes   MEDICATIONS: - Continue Lantus 40 units daily  - Continue Novolog 1 units for every 12 grams of carbohydrates  - CF: Novolog ( BG-110/50)  EDUCATION / INSTRUCTIONS: BG monitoring instructions: Patient is instructed to check his blood sugars 4 times a day, before meals and bedtime  Call [facility] if: BG persistently < 70 I reviewed the Rule of 15 for the treatment of hypoglycemia in detail with the patient. Literature supplied.   2) Diabetic complications:  Eye: Does nothave known diabetic retinopathy.  Neuro/ Feet: Does not have known diabetic peripheral neuropathy .  Renal: Patient does not have known baseline CKD. He   is not  on an ACEI/ARB at present.      F/U in 6 months    Signed electronically by: Mack Guise, MD  [facility]  [address] [place], [place] [address] Phone: [phone] FAX: [phone]   CC: Ellamae Sia, MD (Inactive) [address] [place] [address] Phone: [phone]  Fax: [phone]  Return to Endocrinology clinic as below: No future appointments.

## 2021-04-16 ENCOUNTER — Telehealth: Payer: Self-pay

## 2021-04-16 MED ORDER — NOVOLOG FLEXPEN 100 UNIT/ML ~~LOC~~ SOPN: PEN_INJECTOR | SUBCUTANEOUS | 3 refills | Status: AC

## 2021-04-16 NOTE — Telephone Encounter (Signed)
Script corrected and resent.

## 2021-04-17 ENCOUNTER — Other Ambulatory Visit (INDEPENDENT_AMBULATORY_CARE_PROVIDER_SITE_OTHER): Payer: Self-pay | Admitting: Family

## 2021-04-17 DIAGNOSIS — G40209 Localization-related (focal) (partial) symptomatic epilepsy and epileptic syndromes with complex partial seizures, not intractable, without status epilepticus: Secondary | ICD-10-CM

## 2021-04-23 NOTE — Telephone Encounter (Signed)
Send electronically. Also sent a message to scheduler to have appt scheduled with me in March 2023. TG

## 2021-07-07 ENCOUNTER — Other Ambulatory Visit (INDEPENDENT_AMBULATORY_CARE_PROVIDER_SITE_OTHER): Payer: Self-pay | Admitting: Family

## 2021-07-07 DIAGNOSIS — G40209 Localization-related (focal) (partial) symptomatic epilepsy and epileptic syndromes with complex partial seizures, not intractable, without status epilepticus: Secondary | ICD-10-CM

## 2021-07-07 MED ORDER — LEVETIRACETAM ER 750 MG PO TB24: 1500.0000 mg | ORAL_TABLET | Freq: Two times a day (BID) | ORAL | 2 refills | Status: DC

## 2021-07-12 ENCOUNTER — Other Ambulatory Visit (INDEPENDENT_AMBULATORY_CARE_PROVIDER_SITE_OTHER): Payer: Self-pay

## 2021-07-12 DIAGNOSIS — G40209 Localization-related (focal) (partial) symptomatic epilepsy and epileptic syndromes with complex partial seizures, not intractable, without status epilepticus: Secondary | ICD-10-CM

## 2021-07-12 MED ORDER — LEVETIRACETAM ER 750 MG PO TB24: 1500.0000 mg | ORAL_TABLET | Freq: Two times a day (BID) | ORAL | 1 refills | Status: DC

## 2021-08-25 ENCOUNTER — Ambulatory Visit (INDEPENDENT_AMBULATORY_CARE_PROVIDER_SITE_OTHER): Payer: Medicaid Other | Admitting: Family

## 2021-08-26 ENCOUNTER — Ambulatory Visit (INDEPENDENT_AMBULATORY_CARE_PROVIDER_SITE_OTHER): Payer: Medicaid Other | Admitting: Family

## 2021-08-26 ENCOUNTER — Encounter (INDEPENDENT_AMBULATORY_CARE_PROVIDER_SITE_OTHER): Payer: Self-pay | Admitting: Family

## 2021-08-26 ENCOUNTER — Other Ambulatory Visit: Payer: Self-pay

## 2021-08-26 ENCOUNTER — Other Ambulatory Visit (INDEPENDENT_AMBULATORY_CARE_PROVIDER_SITE_OTHER): Payer: Self-pay | Admitting: Family

## 2021-08-26 VITALS — BP 110/72 | HR 74 | Ht 66.93 in | Wt 157.6 lb

## 2021-08-26 DIAGNOSIS — G250 Essential tremor: Secondary | ICD-10-CM | POA: Insufficient documentation

## 2021-08-26 DIAGNOSIS — F840 Autistic disorder: Secondary | ICD-10-CM | POA: Diagnosis not present

## 2021-08-26 DIAGNOSIS — G40209 Localization-related (focal) (partial) symptomatic epilepsy and epileptic syndromes with complex partial seizures, not intractable, without status epilepticus: Secondary | ICD-10-CM | POA: Diagnosis not present

## 2021-08-26 DIAGNOSIS — G43009 Migraine without aura, not intractable, without status migrainosus: Secondary | ICD-10-CM

## 2021-08-26 DIAGNOSIS — R112 Nausea with vomiting, unspecified: Secondary | ICD-10-CM

## 2021-08-26 DIAGNOSIS — E1065 Type 1 diabetes mellitus with hyperglycemia: Secondary | ICD-10-CM

## 2021-08-26 MED ORDER — VALTOCO 20 MG DOSE 10 MG/0.1ML NA LQPK: NASAL | 5 refills | Status: DC

## 2021-08-26 MED ORDER — RELPAX 40 MG PO TABS: ORAL_TABLET | ORAL | 5 refills | Status: DC

## 2021-08-26 MED ORDER — LEVETIRACETAM ER 750 MG PO TB24: 1500.0000 mg | ORAL_TABLET | Freq: Two times a day (BID) | ORAL | 5 refills | Status: DC

## 2021-08-26 MED ORDER — PROMETHAZINE HCL 25 MG PO TABS: ORAL_TABLET | ORAL | 5 refills | Status: DC

## 2021-08-26 NOTE — Patient Instructions (Signed)
It was a pleasure to see you today! ? ?Instructions for you until your next appointment are as follows: ?I changed the Diastat rectal gel to Valtoco nasal spray. It is the same medication to stop a seizure but is given as a nasal spray. If a seizure occurs, give 1 spray into each nostril. If seizures occur after that, you can repeat it in 4 hours if needed.  ?Peter [name] is showing tremor in his hands, called "benign essential tremor". It is usually worse with anxiety, stress or being tired. We typically do not treat it with medication unless the tremor is interfering with daily living, such as eating or doing other activities. ?If the tremor worsens, let me know.  ?It is ok for Peter [name] to wear a wrist support when working at his computer. Start with a soft compression sleeve to help give his wrist and hands support. ?Please sign up for MyChart if you have not done so. ?Please plan to return for follow up in 6 months or sooner if needed. ? ? ?Feel free to contact our office during normal business hours at [phone] with questions or concerns. If there is no answer or the call is outside business hours, please leave a message and our clinic staff will call you back within the next business day.  If you have an urgent concern, please stay on the line for our after-hours answering service and ask for the on-call neurologist.   ?  ?I also encourage you to use MyChart to communicate with me more directly. If you have not yet signed up for MyChart within [facility], the front desk staff can help you. However, please note that this inbox is NOT monitored on nights or weekends, and response can take up to 2 business days.  Urgent matters should be discussed with the on-call pediatric neurologist.  ? ?At [facility], we are committed to providing exceptional care. You will receive a patient satisfaction survey through text or email regarding your visit today. Your opinion is important to me. Comments are appreciated.   ?
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
## 2021-08-29 ENCOUNTER — Encounter (INDEPENDENT_AMBULATORY_CARE_PROVIDER_SITE_OTHER): Payer: Self-pay | Admitting: Family

## 2021-08-29 DIAGNOSIS — R112 Nausea with vomiting, unspecified: Secondary | ICD-10-CM | POA: Insufficient documentation

## 2021-09-24 IMAGING — CR DG TOE 5TH 2+V*L*
1 series · 3 of 3 positions shown · non-contrast
Comparison: None.

CLINICAL DATA: Discolored left little toe in a diabetic patient.

EXAM:
DG TOE 5TH LEFT

[Series 1: dg toe 5th left · 0.14mm/px · 3 of 3 slices shown]
[im 1/3]
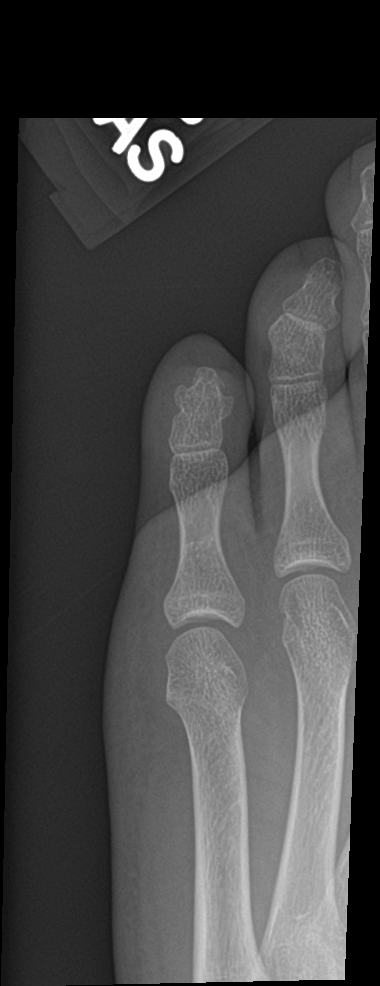
[im 2/3]
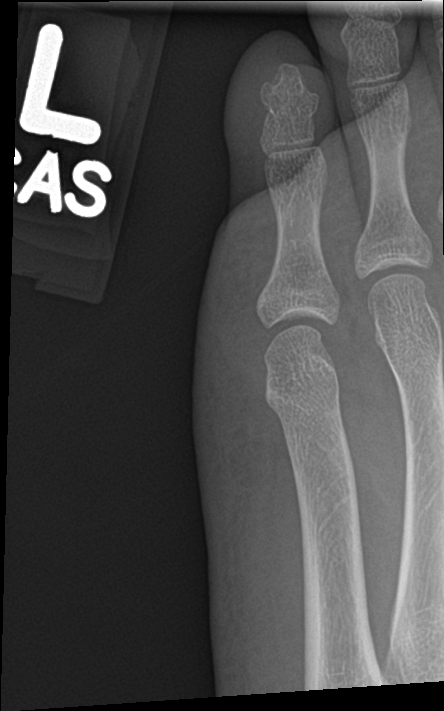
[im 3/3]
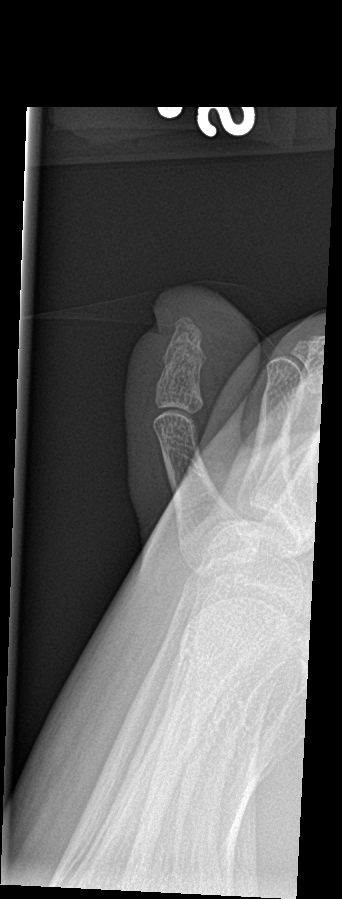

[3 of 3 positions shown; findings below may reference images not displayed]

FINDINGS: No fracture or dislocation is identified. No bony destructive change
or periosteal reaction. Failure of segmentation of the middle and
distal phalanges incidentally noted. No soft tissue gas or
radiopaque foreign body.
IMPRESSION: Negative exam.

## 2021-10-13 ENCOUNTER — Encounter: Payer: Medicaid Other | Admitting: Internal Medicine

## 2021-10-13 NOTE — Progress Notes (Signed)
Err   Virtual Visit via Video Note  I connected with Peter [name] on 10/13/21 at 1:40 pm by a video enabled telemedicine application and verified that I am speaking with the correct person using two identifiers.   I discussed the limitations of evaluation and management by telemedicine and the availability of in person appointments. The patient expressed understanding and agreed to proceed.   -Location of the patient : -Location of the provider : Office -The names of all persons participating in the telemedicine service : Pt, parent  and myself        Name: NESTA KIMPLE  Age/ Sex: 26 y.o., male   MRN/ DOB: [id number], September 03, 1995     PCP: Ellamae Sia, MD   Reason for Endocrinology Evaluation: Type 1 Diabetes Mellitus  Initial Endocrine Consultative Visit: 04/20/2020    PATIENT IDENTIFIER: Peter [name] is a 26 y.o. male with a past medical history of T1DM, Autistic dosorder, ADHD, and epilepsy . The patient has followed with Endocrinology clinic since 04/20/2020 for consultative assistance with management of his diabetes.  DIABETIC HISTORY:  Mr. Kazlauskas was diagnosed with DM at age 56. His hemoglobin A1c has ranged from 7.3%in 2019, peaking at 12.3% in 2008.  Has to sleep with BG > 100 mg/dL SUBJECTIVE:   During the last visit (04/14/2021): A1c 8.3% no changes were made   Today (10/13/2021): Mr. Steidle is here with his Brother [name] .  He checks his blood sugars 10 times daily, preprandial to breakfast  The patient has not had hypoglycemic episodes since the last clinic visit.  Denies nausea, vomiting  or diarrhea   HOME DIABETES REGIMEN:  Lantus 40 units daily   Novolog 1 units for every 12 grams of carbohydrates  CF: Novolog ( BG-110/50)   Statin: no ACE-I/ARB: no Prior Diabetic Education: yes   METER DOWNLOAD SUMMARY: Date range evaluated: 10/20-11/07/2020  Average Number Tests/Day = 10.1 Overall Mean FS Glucose = 180 Standard Deviation = 47  BG  Ranges: Low = 70 [name] = 303   Hypoglycemic Events/30 Days: BG < 50 = 0 Episodes of symptomatic severe hypoglycemia = 0    DIABETIC COMPLICATIONS: Microvascular complications:    Denies: retinopathy, neuropathy, CKD  Last eye exam: Completed 03/12/2020   Macrovascular complications:    Denies: CAD, PVD, CVA     HISTORY:  Past Medical History:  Past Medical History:  Diagnosis Date   Seizures ([place])    Past Surgical History:  Past Surgical History:  Procedure Laterality Date   [place]  2004   Social History:  reports that he has never smoked. He has never used smokeless tobacco. He reports that he does not drink alcohol and does not use drugs. Family History:  Family History  Problem Relation Age of Onset   Breast cancer Paternal Grandfather        Died at 84     HOME MEDICATIONS: Allergies as of 10/13/2021       Reactions   Amphetamine-dextroamphetamine Other (See Comments)   aggression   Bee Venom Other (See Comments)   Seroquel [quetiapine Fumarate]    Aripiprazole Rash, Other (See Comments)   Sleepiness, migraines,  Speech problems, swallowing problems, crossing of eyes, violence, tics.    Azithromycin Rash, Other (See Comments)   hives   Cefdinir Rash, Other (See Comments)   Texture causes emesis   Clarithromycin Rash, Other (See Comments)   hives   Clonidine Rash, Other (  See Comments)   More than $RemoveB'3mg'VaBhrfEz$  per day causes aggression and blood pressure drops   Cyproheptadine Rash, Other (See Comments)   Upper body jerks, nervous system distress   Hornet Venom Rash   Lamotrigine Rash, Other (See Comments)   Rash around eyes, pulls out eyelids   Midazolam Rash, Other (See Comments)   Heart rate drop suddenly and dramatically   Penicillins Rash, Other (See Comments)   hives   Quetiapine Rash, Other (See Comments)    Sleepiness, migraines,  Speech problems, swallowing problems, crossing of eyes, violence, tics.    Risperidone  Rash, Other (See Comments)   Over $Rem'3mg'ADfw$  causes seizures.   Topiramate Rash, Other (See Comments)   Increased seizure frequency   Valproic Acid Rash, Other (See Comments)   Personality change, losses muscle tone in face.        Medication List        Accurate as of Oct 13, 2021  7:48 AM. If you have any questions, ask your nurse or doctor.          Accu-Chek Guide test strip Generic drug: glucose blood 1 each by Other route as directed. Up to 20x daily   Accu-Chek Multiclix Lancet Dev Kit Test daily before all meals/snacks and once before bedtime.   accu-chek multiclix lancets 1 each by Other route in the morning, at noon, in the evening, and at bedtime. Use as directed to test blood sugar 12 times daily   amphetamine-dextroamphetamine 30 MG tablet Commonly known as: ADDERALL Take by mouth.   cetirizine 10 MG tablet Commonly known as: ZYRTEC   EPINEPHrine 0.3 mg/0.3 mL Soaj injection Commonly known as: EPI-PEN Inject 1 pen injector intramuscularly single dose as needed for anaphylaxis. Call 911. Can use 2nd pen in 5 minutes if no improvement.   fluticasone 50 MCG/ACT nasal spray Commonly known as: FLONASE SHAKE LIQUID AND USE 2 SPRAYS IN EACH NOSTRIL ONCE DAILY   glucagon 1 MG injection Inject 1 mg into the vein once as needed (Administer 1 mg injection IM when blood sugar is below 75, if patient is unable to swallow for any reason and or oral sugar intake does not increase blood sugar level.).   guanFACINE 2 MG Tb24 ER tablet Commonly known as: INTUNIV TK 1 T PO D   Lantus SoloStar 100 UNIT/ML Solostar Pen Generic drug: insulin glargine Inject 40 Units into the skin daily.   Levetiracetam 750 MG Tb24 Take 2 tablets (1,500 mg total) by mouth 2 (two) times daily.   LORazepam 1 MG tablet Commonly known as: ATIVAN Take 1 mg by mouth. Take 1 tablet as needed for agitation   melatonin 3 MG Tabs tablet Take 10 mg by mouth.   methylphenidate 20 MG  tablet Commonly known as: RITALIN Take by mouth.   NovoLOG FlexPen 100 UNIT/ML FlexPen Generic drug: insulin aspart Max daily 50 units   omeprazole 20 MG capsule Commonly known as: PRILOSEC Take 20 mg by mouth.   promethazine 25 MG tablet Commonly known as: PHENERGAN TAKE ONE TABLET BY MOUTH AS NEEDED FOR NAUSEA ASSOCIATED WITH MIGRAINE   RA Blood Glucose Monitor Devi Use as directed to test blood sugar 12 times daily Dose: 1   Relpax 40 MG tablet Generic drug: eletriptan TAKE 1 TAB AS DIRECTED AS NEEDED. MAY REPEAT IN 2 HRS IF HEADACHE PERSISTS OR RECURS   risperiDONE 1 MG tablet Commonly known as: RISPERDAL Take $RemoveBefore'2mg'DrrnWVvavNKGo$  in the morning; $RemoveBefo'3mg'fsRvIDQnMRQ$  at night   UltiCare Micro Pen Needles  32G X 4 MM Misc Generic drug: Insulin Pen Needle Inject 1 Device into the skin as directed. Use as directed to inject insulin 6 - 8 times daily.   Valtoco 20 MG Dose 2 x 10 MG/0.1ML Lqpk Generic drug: diazePAM (20 MG Dose) Give 1 spray in each nostril after 2 minutes of seizure         OBJECTIVE:   Vital Signs: There were no vitals taken for this visit.  Wt Readings from Last 3 Encounters:  08/26/21 157 lb 9.6 oz (71.5 kg)  04/14/21 164 lb (74.4 kg)  09/10/20 165 lb 8 oz (75.1 kg)     Exam: General: Pt appears well and is in NAD     DM Foot Exam 09/10/2020    The skin of the feet is intact without sores or ulcerations. The pedal pulses are 2+ on right and 2+ on left. The sensation is difficult to assess due to autism , pt kept saying yes     DATA REVIEWED:  Lab Results  Component Value Date   HGBA1C 8.3 (A) 04/14/2021   HGBA1C 7.4 (A) 09/10/2020   HGBA1C 7.5 (A) 04/20/2020   Results for CORBITT, CLOKE (MRN [id number]) as of 04/14/2021 17:29  Ref. Range 04/14/2021 13:52  Sodium Latest Ref Range: 135 - 145 mEq/L 138  Potassium Latest Ref Range: 3.5 - 5.1 mEq/L 4.3  Chloride Latest Ref Range: 96 - 112 mEq/L 101  CO2 Latest Ref Range: 19 - 32 mEq/L 28  Glucose Latest Ref Range:  70 - 99 mg/dL 973 (H)  BUN Latest Ref Range: 6 - 23 mg/dL 7  Creatinine Latest Ref Range: 0.40 - 1.50 mg/dL 6.14  Calcium Latest Ref Range: 8.4 - 10.5 mg/dL 92.9  GFR Latest Ref Range: >60.00 mL/min 99.72  Total CHOL/HDL Ratio Unknown 3  Cholesterol Latest Ref Range: 0 - 200 mg/dL 882  HDL Cholesterol Latest Ref Range: >39.00 mg/dL 13.65  LDL (calc) Latest Ref Range: 0 - 99 mg/dL 71  MICROALB/CREAT RATIO Latest Ref Range: 0.0 - 30.0 mg/g 2.2  NonHDL Unknown 97.04  Triglycerides Latest Ref Range: 0.0 - 149.0 mg/dL 366.4  VLDL Latest Ref Range: 0.0 - 40.0 mg/dL 01.7  TSH Latest Ref Range: 0.35 - 5.50 uIU/mL 0.87   ASSESSMENT / PLAN / RECOMMENDATIONS:   1) Type 1 Diabetes Mellitus, Sub-Optimally controlled, Without complications - Most recent A1c of 8.3%. Goal A1c < 7.5%.   - He did not tolerate CGM in the past per mother  - I have asked  the brother to ease off on the finger sticks again, this is done sometimes hourly or 2 hrs through the day and nigh ( when does the pt sleep?) but due to history of recurrent hypoglycemia and autism , they feel more comfortable with frequent checks. - I emphasized the importance of taking Novolog Pre-meal  , I also emphasized the importance of separating NovoLog doses by 3-4 hours, to prevent stacking of insulin and hypoglycemia. -BMP normal as well as lipid and TFTs - No changes   MEDICATIONS: - Continue Lantus 40 units daily  - Continue Novolog 1 units for every 12 grams of carbohydrates  - CF: Novolog ( BG-110/50)  EDUCATION / INSTRUCTIONS: BG monitoring instructions: Patient is instructed to check his blood sugars 4 times a day, before meals and bedtime  Call [facility] if: BG persistently < 70 I reviewed the Rule of 15 for the treatment of hypoglycemia in detail with the patient. Literature supplied.  2) Diabetic complications:  Eye: Does nothave known diabetic retinopathy.  Neuro/ Feet: Does not have known diabetic  peripheral neuropathy .  Renal: Patient does not have known baseline CKD. He   is not  on an ACEI/ARB at present.      F/U in 6 months    Signed electronically by: Mack Guise, MD  [facility]  [facility] [address] [place], [place] [address] Phone: [phone] FAX: [phone]   CC: Ellamae Sia, MD [employer] [place] [address] Phone: [phone]  Fax: [phone]  Return to Endocrinology clinic as below: Future Appointments  Date Time Provider [place]  10/13/2021  1:40 PM Antonious Omahoney, Melanie Crazier, MD [facility] None  02/28/2022  9:30 AM Rockwell Germany, NP [facility] None

## 2021-10-28 ENCOUNTER — Encounter: Payer: Self-pay | Admitting: Internal Medicine

## 2021-10-28 ENCOUNTER — Encounter: Payer: Medicaid Other | Admitting: Internal Medicine

## 2021-10-28 NOTE — Progress Notes (Signed)
Virtual Visit via Video Note  I connected with [name] on 10/28/21 at 1:40 pm by a video enabled telemedicine application and verified that I am speaking with the correct person using two identifiers.   I discussed the limitations of evaluation and management by telemedicine and the availability of in person appointments. The patient expressed understanding and agreed to proceed.   -Location of the patient : -Location of the provider : Office -The names of all persons participating in the telemedicine service : Pt, parent  and myself        Name: Peter [name]  Age/ Sex: 26 y.o., male   MRN/ DOB: [id number], 1996/06/01     PCP: Hyman Hopes, MD   Reason for Endocrinology Evaluation: Type 1 Diabetes Mellitus  Initial Endocrine Consultative Visit: 04/20/2020    PATIENT IDENTIFIER: Mr. Peter [name] is a 26 y.o. male with a past medical history of T1DM, Autistic dosorder, ADHD, and epilepsy . The patient has followed with Endocrinology clinic since 04/20/2020 for consultative assistance with management of his diabetes.  DIABETIC HISTORY:  Mr. Tutson was diagnosed with DM at age 64. His hemoglobin A1c has ranged from 7.3%in 2019, peaking at 12.3% in 2008.  Has to sleep with BG > 100 mg/dL SUBJECTIVE:   During the last visit (04/14/2021): A1c 8.3% no changes were made   Today (10/28/2021): Mr. Highfill is here with his Brother Earna Coder .  He checks his blood sugars 10 times daily, preprandial to breakfast  The patient has not had hypoglycemic episodes since the last clinic visit.  Denies nausea, vomiting  or diarrhea   HOME DIABETES REGIMEN:  Lantus 40 units daily   Novolog 1 units for every 12 grams of carbohydrates  CF: Novolog ( BG-110/50)   Statin: no ACE-I/ARB: no Prior Diabetic Education: yes   METER DOWNLOAD SUMMARY: Date range evaluated: 10/20-11/07/2020  Average Number Tests/Day = 10.1 Overall Mean FS Glucose = 180 Standard Deviation = 47  BG  Ranges: Low = 70 High = 303   Hypoglycemic Events/30 Days: BG < 50 = 0 Episodes of symptomatic severe hypoglycemia = 0    DIABETIC COMPLICATIONS: Microvascular complications:    Denies: retinopathy, neuropathy, CKD  Last eye exam: Completed 03/12/2020   Macrovascular complications:    Denies: CAD, PVD, CVA     HISTORY:  Past Medical History:  Past Medical History:  Diagnosis Date   Seizures ([facility])    Past Surgical History:  Past Surgical History:  Procedure Laterality Date   CIRCUMCISION  32   DENTAL SURGERY  2004   Social History:  reports that he has never smoked. He has never used smokeless tobacco. He reports that he does not drink alcohol and does not use drugs. Family History:  Family History  Problem Relation Age of Onset   Breast cancer Paternal Grandfather        Died at 30     HOME MEDICATIONS: Allergies as of 10/28/2021       Reactions   Amphetamine-dextroamphetamine Other (See Comments)   aggression   Bee Venom Other (See Comments)   Seroquel [quetiapine Fumarate]    Aripiprazole Rash, Other (See Comments)   Sleepiness, migraines,  Speech problems, swallowing problems, crossing of eyes, violence, tics.    Azithromycin Rash, Other (See Comments)   hives   Cefdinir Rash, Other (See Comments)   Texture causes emesis   Clarithromycin Rash, Other (See Comments)   hives   Clonidine Rash, Other (See Comments)  More than $RemoveB'3mg'FkIqItuv$  per day causes aggression and blood pressure drops   Cyproheptadine Rash, Other (See Comments)   Upper body jerks, nervous system distress   Hornet Venom Rash   Lamotrigine Rash, Other (See Comments)   Rash around eyes, pulls out eyelids   Midazolam Rash, Other (See Comments)   Heart rate drop suddenly and dramatically   Penicillins Rash, Other (See Comments)   hives   Quetiapine Rash, Other (See Comments)    Sleepiness, migraines,  Speech problems, swallowing problems, crossing of eyes, violence, tics.    Risperidone  Rash, Other (See Comments)   Over $Rem'3mg'uWwU$  causes seizures.   Topiramate Rash, Other (See Comments)   Increased seizure frequency   Valproic Acid Rash, Other (See Comments)   Personality change, losses muscle tone in face.        Medication List        Accurate as of Oct 28, 2021  8:51 AM. If you have any questions, ask your nurse or doctor.          Accu-Chek Guide test strip Generic drug: glucose blood 1 each by Other route as directed. Up to 20x daily   Accu-Chek Multiclix Lancet Dev Kit Test daily before all meals/snacks and once before bedtime.   accu-chek multiclix lancets 1 each by Other route in the morning, at noon, in the evening, and at bedtime. Use as directed to test blood sugar 12 times daily   amphetamine-dextroamphetamine 30 MG tablet Commonly known as: ADDERALL Take by mouth.   cetirizine 10 MG tablet Commonly known as: ZYRTEC   EPINEPHrine 0.3 mg/0.3 mL Soaj injection Commonly known as: EPI-PEN Inject 1 pen injector intramuscularly single dose as needed for anaphylaxis. Call 911. Can use 2nd pen in 5 minutes if no improvement.   fluticasone 50 MCG/ACT nasal spray Commonly known as: FLONASE SHAKE LIQUID AND USE 2 SPRAYS IN EACH NOSTRIL ONCE DAILY   glucagon 1 MG injection Inject 1 mg into the vein once as needed (Administer 1 mg injection IM when blood sugar is below 75, if patient is unable to swallow for any reason and or oral sugar intake does not increase blood sugar level.).   guanFACINE 2 MG Tb24 ER tablet Commonly known as: INTUNIV TK 1 T PO D   Lantus SoloStar 100 UNIT/ML Solostar Pen Generic drug: insulin glargine Inject 40 Units into the skin daily.   Levetiracetam 750 MG Tb24 Take 2 tablets (1,500 mg total) by mouth 2 (two) times daily.   LORazepam 1 MG tablet Commonly known as: ATIVAN Take 1 mg by mouth. Take 1 tablet as needed for agitation   melatonin 3 MG Tabs tablet Take 10 mg by mouth.   methylphenidate 20 MG  tablet Commonly known as: RITALIN Take by mouth.   NovoLOG FlexPen 100 UNIT/ML FlexPen Generic drug: insulin aspart Max daily 50 units   omeprazole 20 MG capsule Commonly known as: PRILOSEC Take 20 mg by mouth.   promethazine 25 MG tablet Commonly known as: PHENERGAN TAKE ONE TABLET BY MOUTH AS NEEDED FOR NAUSEA ASSOCIATED WITH MIGRAINE   RA Blood Glucose Monitor Devi Use as directed to test blood sugar 12 times daily Dose: 1   Relpax 40 MG tablet Generic drug: eletriptan TAKE 1 TAB AS DIRECTED AS NEEDED. MAY REPEAT IN 2 HRS IF HEADACHE PERSISTS OR RECURS   risperiDONE 1 MG tablet Commonly known as: RISPERDAL Take $RemoveBefore'2mg'VgXouYKfCvdzq$  in the morning; $RemoveBefo'3mg'TSzUunzZrAe$  at night   UltiCare Micro Pen Needles 32G X 4 MM  Misc Generic drug: Insulin Pen Needle Inject 1 Device into the skin as directed. Use as directed to inject insulin 6 - 8 times daily.   Valtoco 20 MG Dose 2 x 10 MG/0.1ML Lqpk Generic drug: diazePAM (20 MG Dose) Give 1 spray in each nostril after 2 minutes of seizure         OBJECTIVE:   Vital Signs: There were no vitals taken for this visit.  Wt Readings from Last 3 Encounters:  08/26/21 157 lb 9.6 oz (71.5 kg)  04/14/21 164 lb (74.4 kg)  09/10/20 165 lb 8 oz (75.1 kg)     Exam: General: Pt appears well and is in NAD     DM Foot Exam 09/10/2020    The skin of the feet is intact without sores or ulcerations. The pedal pulses are 2+ on right and 2+ on left. The sensation is difficult to assess due to autism , pt kept saying yes     DATA REVIEWED:  Lab Results  Component Value Date   HGBA1C 8.3 (A) 04/14/2021   HGBA1C 7.4 (A) 09/10/2020   HGBA1C 7.5 (A) 04/20/2020   Results for SOCORRO, EBRON (MRN [id number]) as of 04/14/2021 17:29  Ref. Range 04/14/2021 13:52  Sodium Latest Ref Range: 135 - 145 mEq/L 138  Potassium Latest Ref Range: 3.5 - 5.1 mEq/L 4.3  Chloride Latest Ref Range: 96 - 112 mEq/L 101  CO2 Latest Ref Range: 19 - 32 mEq/L 28  Glucose Latest Ref Range:  70 - 99 mg/dL 141 (H)  BUN Latest Ref Range: 6 - 23 mg/dL 7  Creatinine Latest Ref Range: 0.40 - 1.50 mg/dL 1.05  Calcium Latest Ref Range: 8.4 - 10.5 mg/dL 10.1  GFR Latest Ref Range: >60.00 mL/min 99.72  Total CHOL/HDL Ratio Unknown 3  Cholesterol Latest Ref Range: 0 - 200 mg/dL 148  HDL Cholesterol Latest Ref Range: >39.00 mg/dL 51.00  LDL (calc) Latest Ref Range: 0 - 99 mg/dL 71  MICROALB/CREAT RATIO Latest Ref Range: 0.0 - 30.0 mg/g 2.2  NonHDL Unknown 97.04  Triglycerides Latest Ref Range: 0.0 - 149.0 mg/dL 132.0  VLDL Latest Ref Range: 0.0 - 40.0 mg/dL 26.4  TSH Latest Ref Range: 0.35 - 5.50 uIU/mL 0.87   ASSESSMENT / PLAN / RECOMMENDATIONS:   1) Type 1 Diabetes Mellitus, Sub-Optimally controlled, Without complications - Most recent A1c of 8.3%. Goal A1c < 7.5%.   - He did not tolerate CGM in the past per mother  - I have asked  the brother to ease off on the finger sticks again, this is done sometimes hourly or 2 hrs through the day and nigh ( when does the pt sleep?) but due to history of recurrent hypoglycemia and autism , they feel more comfortable with frequent checks. - I emphasized the importance of taking Novolog Pre-meal  , I also emphasized the importance of separating NovoLog doses by 3-4 hours, to prevent stacking of insulin and hypoglycemia. -BMP normal as well as lipid and TFTs - No changes   MEDICATIONS: - Continue Lantus 40 units daily  - Continue Novolog 1 units for every 12 grams of carbohydrates  - CF: Novolog ( BG-110/50)  EDUCATION / INSTRUCTIONS: BG monitoring instructions: Patient is instructed to check his blood sugars 4 times a day, before meals and bedtime  Call [place] [facility] clinic if: BG persistently < 70 I reviewed the Rule of 15 for the treatment of hypoglycemia in detail with the patient. Literature supplied.   2) Diabetic complications:  Eye: Does nothave known diabetic retinopathy.  Neuro/ Feet: Does not have known diabetic  peripheral neuropathy .  Renal: Patient does not have known baseline CKD. He   is not  on an ACEI/ARB at present.      F/U in 6 months    Signed electronically by: Mack Guise, MD  [facility]in[facility]  [facility] [address] [place], [place] [address] Phone: [phone] FAX: [phone]   CC: Ellamae Sia, MD [place] [address] Phone: [phone]  Fax: [phone]  Return to Endocrinology clinic as below: Future Appointments  Date Time Provider [place]  10/28/2021 12:50 PM Brynleigh Sequeira, Melanie Crazier, MD [facility] None  02/28/2022  9:30 AM Rockwell Germany, NP [facility] None

## 2022-01-04 ENCOUNTER — Encounter: Payer: Self-pay | Admitting: Internal Medicine
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
## 2022-02-28 ENCOUNTER — Other Ambulatory Visit (INDEPENDENT_AMBULATORY_CARE_PROVIDER_SITE_OTHER): Payer: Self-pay | Admitting: Family

## 2022-02-28 ENCOUNTER — Encounter (INDEPENDENT_AMBULATORY_CARE_PROVIDER_SITE_OTHER): Payer: Self-pay

## 2022-02-28 ENCOUNTER — Ambulatory Visit (INDEPENDENT_AMBULATORY_CARE_PROVIDER_SITE_OTHER): Payer: Medicaid Other | Admitting: Family

## 2022-02-28 ENCOUNTER — Encounter (INDEPENDENT_AMBULATORY_CARE_PROVIDER_SITE_OTHER): Payer: Self-pay | Admitting: Family

## 2022-02-28 VITALS — BP 104/84 | HR 96 | Ht 67.09 in | Wt 166.0 lb

## 2022-02-28 DIAGNOSIS — Z7289 Other problems related to lifestyle: Secondary | ICD-10-CM

## 2022-02-28 DIAGNOSIS — G40209 Localization-related (focal) (partial) symptomatic epilepsy and epileptic syndromes with complex partial seizures, not intractable, without status epilepticus: Secondary | ICD-10-CM

## 2022-02-28 DIAGNOSIS — F802 Mixed receptive-expressive language disorder: Secondary | ICD-10-CM

## 2022-02-28 DIAGNOSIS — F840 Autistic disorder: Secondary | ICD-10-CM | POA: Diagnosis not present

## 2022-02-28 DIAGNOSIS — G43009 Migraine without aura, not intractable, without status migrainosus: Secondary | ICD-10-CM

## 2022-02-28 DIAGNOSIS — R112 Nausea with vomiting, unspecified: Secondary | ICD-10-CM

## 2022-02-28 DIAGNOSIS — E1065 Type 1 diabetes mellitus with hyperglycemia: Secondary | ICD-10-CM

## 2022-02-28 DIAGNOSIS — G250 Essential tremor: Secondary | ICD-10-CM | POA: Diagnosis not present

## 2022-02-28 DIAGNOSIS — F6381 Intermittent explosive disorder: Secondary | ICD-10-CM

## 2022-02-28 MED ORDER — VALTOCO 20 MG DOSE 10 MG/0.1ML NA LQPK: NASAL | 5 refills | Status: DC

## 2022-02-28 MED ORDER — PROMETHAZINE HCL 25 MG PO TABS: ORAL_TABLET | ORAL | 5 refills | Status: DC

## 2022-02-28 MED ORDER — LEVETIRACETAM ER 750 MG PO TB24: 1500.0000 mg | ORAL_TABLET | Freq: Two times a day (BID) | ORAL | 5 refills | Status: DC

## 2022-02-28 MED ORDER — RELPAX 40 MG PO TABS: ORAL_TABLET | ORAL | 5 refills | Status: DC

## 2022-02-28 NOTE — Patient Instructions (Signed)
It was a pleasure to see you today!  Instructions for you until your next appointment are as follows: We can consider the medication Topiramate to reduce how often migraines occur. This is a seizure medication by class, but is also know to reduce the frequency of migraine headaches. This medication has an FDA approval for migraine prevention. Potential side effects of this medication when used in low doses for migraines are reduced appetite and tingling in the fingers and toes if you do not drink enough water.  Please let me know if you would like to try Topiramate for migraine prevention.  There are other medications that can be used but some of them would affect the diabetes or interact with other medications. There are injectable medications given once per month that are also available, but they are expensive and we would need to see about insurance coverage for him.  I have refilled Loi's medications.  Please let me know if he has any seizures or if you have any other concerns. Please sign up for MyChart if you have not done so. Please plan to return for follow up in 6 months  or sooner if needed.   Feel free to contact our office during normal business hours at [phone] with questions or concerns. If there is no answer or the call is outside business hours, please leave a message and our clinic staff will call you back within the next business day.  If you have an urgent concern, please stay on the line for our after-hours answering service and ask for the on-call neurologist.     I also encourage you to use MyChart to communicate with me more directly. If you have not yet signed up for MyChart within [facility], the front desk staff can help you. However, please note that this inbox is NOT monitored on nights or weekends, and response can take up to 2 business days.  Urgent matters should be discussed with the on-call pediatric neurologist.   At [facility], we are committed to  providing exceptional care. You will receive a patient satisfaction survey through text or email regarding your visit today. Your opinion is important to me. Comments are appreciated.

## 2022-03-01 NOTE — Telephone Encounter (Signed)
I called Mom to see what medications had been tried and failed. She was unsure of specifics but believes that he tried Sumatriptan and Rizatriptan as a child. I submitted the PA request and it is pending approval. TG

## 2022-03-01 NOTE — Telephone Encounter (Signed)
Insurance approved Relpax today. I left a message to let the pharmacy know. TG

## 2022-03-03 ENCOUNTER — Encounter (INDEPENDENT_AMBULATORY_CARE_PROVIDER_SITE_OTHER): Payer: Self-pay | Admitting: Family

## 2022-09-13 ENCOUNTER — Other Ambulatory Visit (INDEPENDENT_AMBULATORY_CARE_PROVIDER_SITE_OTHER): Payer: Self-pay | Admitting: Family

## 2022-09-13 ENCOUNTER — Ambulatory Visit (INDEPENDENT_AMBULATORY_CARE_PROVIDER_SITE_OTHER): Payer: Self-pay | Admitting: Family

## 2022-09-13 DIAGNOSIS — G40209 Localization-related (focal) (partial) symptomatic epilepsy and epileptic syndromes with complex partial seizures, not intractable, without status epilepticus: Secondary | ICD-10-CM

## 2022-09-13 DIAGNOSIS — R112 Nausea with vomiting, unspecified: Secondary | ICD-10-CM

## 2022-09-13 DIAGNOSIS — G43009 Migraine without aura, not intractable, without status migrainosus: Secondary | ICD-10-CM

## 2022-09-13 MED ORDER — PROMETHAZINE HCL 25 MG PO TABS: ORAL_TABLET | ORAL | 1 refills | Status: AC

## 2022-09-13 MED ORDER — LEVETIRACETAM ER 750 MG PO TB24: 1500.0000 mg | ORAL_TABLET | Freq: Two times a day (BID) | ORAL | 1 refills | Status: AC

## 2022-09-13 MED ORDER — RELPAX 40 MG PO TABS: ORAL_TABLET | ORAL | 5 refills | Status: AC

## 2022-09-13 MED ORDER — VALTOCO 20 MG DOSE 10 MG/0.1ML NA LQPK: NASAL | 5 refills | Status: AC

## 2022-09-13 NOTE — Telephone Encounter (Signed)
  Name of who is calling: Kizzie Furnish Relationship to Patient: Mom  Best contact number: [phone]  Provider they see: Rockwell Germany  Reason for call: Mom called and stated that Dreshun has an appointment this afternoon that he will not be going to. She's also called to get a three month supply on medication. She states they are moving soon. Mom would like a callback with update.      PRESCRIPTION REFILL ONLY  Name of prescription: levetiracetam, promethazine, nasal spray, relpax  Pharmacy: [facility] [address]

## 2022-09-13 NOTE — Telephone Encounter (Signed)
Contacted patients mother. Verified patients name and DOB as well as mothers name.   Mom stated that they're moving to [place] on May 2nd 2024 and will have a gap in insurance. This is the reason she is requesting a 90 day supply.   Toan hasn't been feeling well and has been up all night. This is why he will not make his appointment.   SS, CCMA

## 2022-09-13 NOTE — Telephone Encounter (Signed)
I sent in refills as requested. TG
